# Patient Record
Sex: Female | Born: 1985 | Race: Black or African American | Hispanic: No | Marital: Single | Smoking: Never smoker
Health system: Southern US, Community
[De-identification: ages and names within clinical notes are randomized; demographics above are authoritative.]

## PROBLEM LIST (undated history)

## (undated) DIAGNOSIS — I10 Essential (primary) hypertension: Secondary | ICD-10-CM

---
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
## 2006-11-11 ENCOUNTER — Emergency Department (HOSPITAL_COMMUNITY): Admission: EM | Admit: 2006-11-11 | Discharge: 2006-11-11 | Payer: Self-pay | Admitting: Emergency Medicine

## 2006-12-02 ENCOUNTER — Encounter: Admission: RE | Admit: 2006-12-02 | Discharge: 2006-12-02 | Payer: Self-pay | Admitting: Orthopedic Surgery

## 2006-12-21 ENCOUNTER — Encounter: Admission: RE | Admit: 2006-12-21 | Discharge: 2007-01-04 | Payer: Self-pay | Admitting: Orthopedic Surgery

## 2008-01-16 ENCOUNTER — Emergency Department (HOSPITAL_COMMUNITY): Admission: EM | Admit: 2008-01-16 | Discharge: 2008-01-16 | Payer: Self-pay | Admitting: Emergency Medicine

## 2008-07-31 ENCOUNTER — Emergency Department (HOSPITAL_COMMUNITY): Admission: EM | Admit: 2008-07-31 | Discharge: 2008-07-31 | Payer: Self-pay | Admitting: Emergency Medicine

## 2009-05-16 ENCOUNTER — Emergency Department (HOSPITAL_COMMUNITY): Admission: EM | Admit: 2009-05-16 | Discharge: 2009-05-17 | Payer: Self-pay | Admitting: Emergency Medicine

## 2009-11-03 ENCOUNTER — Emergency Department (HOSPITAL_COMMUNITY): Admission: EM | Admit: 2009-11-03 | Discharge: 2009-11-03 | Payer: Self-pay | Admitting: Emergency Medicine

## 2009-11-03 ENCOUNTER — Encounter (INDEPENDENT_AMBULATORY_CARE_PROVIDER_SITE_OTHER): Payer: Self-pay | Admitting: Emergency Medicine

## 2009-11-03 ENCOUNTER — Ambulatory Visit: Payer: Self-pay | Admitting: Vascular Surgery

## 2010-12-17 ENCOUNTER — Emergency Department (HOSPITAL_COMMUNITY)
Admission: EM | Admit: 2010-12-17 | Discharge: 2010-12-17 | Disposition: A | Discharge status: Home / Self Care | Payer: Self-pay | Source: Home / Self Care | Attending: Family Medicine | Admitting: Family Medicine

## 2010-12-17 ENCOUNTER — Emergency Department (INDEPENDENT_AMBULATORY_CARE_PROVIDER_SITE_OTHER): Payer: 59

## 2010-12-17 DIAGNOSIS — S40011A Contusion of right shoulder, initial encounter: Secondary | ICD-10-CM

## 2010-12-17 DIAGNOSIS — S4980XA Other specified injuries of shoulder and upper arm, unspecified arm, initial encounter: Secondary | ICD-10-CM

## 2010-12-17 DIAGNOSIS — S40019A Contusion of unspecified shoulder, initial encounter: Secondary | ICD-10-CM

## 2010-12-17 DIAGNOSIS — S46909A Unspecified injury of unspecified muscle, fascia and tendon at shoulder and upper arm level, unspecified arm, initial encounter: Secondary | ICD-10-CM

## 2010-12-17 MED ORDER — IBUPROFEN 800 MG PO TABS: 800.0000 mg | ORAL_TABLET | Freq: Three times a day (TID) | ORAL | Status: AC

## 2010-12-17 NOTE — ED Notes (Signed)
Pt states she slipped and fell at work on Saturday, landed on rt shoulder.  C/o pain to rt shoulder- states she has torn the rotator cuff in this shoulder several years ago but did not have it fixed.  States this is not a worker's compensation case.

## 2010-12-17 NOTE — ED Provider Notes (Signed)
History     CSN: [id number] Arrival date & time: 12/17/2010  8:12 PM   First MD Initiated Contact with Patient 12/17/10 1852      Chief Complaint  Patient presents with  . Shoulder Injury    (Consider location/radiation/quality/duration/timing/severity/associated sxs/prior treatment) Patient is a 25 y.o. female presenting with shoulder injury. The history is provided by the patient.  Shoulder Injury This is a new problem. The current episode started more than 2 days ago (fell directly on right shoulder at work). The problem has not changed since onset.Associated symptoms comments: H/o rotator cuff injury 3 yr ago.    History reviewed. No pertinent past medical history.  History reviewed. No pertinent past surgical history.  No family history on file.  History  Substance Use Topics  . Smoking status: Never Smoker   . Smokeless tobacco: Not on file  . Alcohol Use: No    OB History    Grav Para Term Preterm Abortions TAB SAB Ect Mult Living                  Review of Systems  Constitutional: Negative.   Musculoskeletal: Positive for joint swelling.  Skin: Negative.     Allergies  Review of patient's allergies indicates no known allergies.  Home Medications  No current outpatient prescriptions on file.  BP 148/90  Pulse 75  Temp(Src) 98.3 F (36.8 C) (Oral)  Resp 16  SpO2 99%  LMP 11/26/2010  Physical Exam  Constitutional: She appears well-developed and well-nourished.  Musculoskeletal: Normal range of motion. She exhibits tenderness.       Arms:   ED Course  Procedures (including critical care time)  Labs Reviewed - No data to display No results found.   No diagnosis found.    MDM  X-rays reviewed and report per radiologist.         Barkley Bruns, MD 12/17/10 2116

## 2011-11-17 ENCOUNTER — Emergency Department (HOSPITAL_COMMUNITY)
Admission: EM | Admit: 2011-11-17 | Discharge: 2011-11-17 | Disposition: A | Discharge status: Home / Self Care | Payer: 59 | Attending: Emergency Medicine | Admitting: Emergency Medicine

## 2011-11-17 ENCOUNTER — Encounter (HOSPITAL_COMMUNITY): Payer: Self-pay | Admitting: Emergency Medicine

## 2011-11-17 DIAGNOSIS — R55 Syncope and collapse: Secondary | ICD-10-CM | POA: Insufficient documentation

## 2011-11-17 DIAGNOSIS — R51 Headache: Secondary | ICD-10-CM | POA: Insufficient documentation

## 2011-11-17 LAB — POCT PREGNANCY, URINE: Preg Test, Ur: NEGATIVE

## 2011-11-17 MED ORDER — SODIUM CHLORIDE 0.9 % IV BOLUS (SEPSIS)
1000.0000 mL | Freq: Once | INTRAVENOUS | Status: AC
  Administered 2011-11-17: 1000 mL via INTRAVENOUS

## 2011-11-17 MED ORDER — IBUPROFEN 600 MG PO TABS: 600.0000 mg | ORAL_TABLET | Freq: Three times a day (TID) | ORAL | Status: DC | PRN

## 2011-11-17 MED ORDER — KETOROLAC TROMETHAMINE 30 MG/ML IJ SOLN
30.0000 mg | Freq: Once | INTRAMUSCULAR | Status: AC
  Administered 2011-11-17: 30 mg via INTRAVENOUS
  Filled 2011-11-17: qty 1

## 2011-11-17 NOTE — ED Provider Notes (Signed)
History     CSN: [id number]  Arrival date & time 11/17/11  1140   First MD Initiated Contact with Patient 11/17/11 1209      Chief Complaint  Patient presents with  . Loss of Consciousness    (Consider location/radiation/quality/duration/timing/severity/associated sxs/prior treatment) HPI Comments: Was working at [employer] and the patient states she developed a headache and lightheadedness. She went to the bathroom and put a wet rag on her face. The next thing she remembers was waking up on any ambulance stretcher. She now states she feels better and ambulated to the bathroom without difficulty. She has a moderate left temporal headache which has improved since this morning. It was gradual onset and not the worst of her life. No neck pain.  Patient is a 26 y.o. female presenting with syncope. The history is provided by the patient and a parent. No language interpreter was used.  Loss of Consciousness This is a new problem. The current episode started today. The problem occurs rarely. The problem has been resolved. Associated symptoms include headaches. Pertinent negatives include no abdominal pain, arthralgias, chest pain, chills, congestion, coughing, fatigue, fever, joint swelling, myalgias, nausea, neck pain, rash, vertigo, vomiting or weakness. Nothing aggravates the symptoms. She has tried nothing for the symptoms. The treatment provided no relief.    No past medical history on file.  No past surgical history on file.  No family history on file.  History  Substance Use Topics  . Smoking status: Never Smoker   . Smokeless tobacco: Not on file  . Alcohol Use: No    OB History    Grav Para Term Preterm Abortions TAB SAB Ect Mult Living                  Review of Systems  Constitutional: Negative for fever, chills, activity change, appetite change and fatigue.  HENT: Negative for congestion, rhinorrhea, neck pain, neck stiffness and sinus pressure.   Eyes: Negative for  discharge and visual disturbance.  Respiratory: Negative for cough, chest tightness, shortness of breath, wheezing and stridor.   Cardiovascular: Positive for syncope. Negative for chest pain and leg swelling.  Gastrointestinal: Negative for nausea, vomiting, abdominal pain, diarrhea and abdominal distention.  Genitourinary: Negative for decreased urine volume and difficulty urinating.  Musculoskeletal: Negative for myalgias, back pain, joint swelling and arthralgias.  Skin: Negative for color change, pallor and rash.  Neurological: Positive for syncope and headaches. Negative for vertigo, weakness and light-headedness.  Psychiatric/Behavioral: Negative for behavioral problems and agitation.  All other systems reviewed and are negative.    Allergies  Review of patient's allergies indicates no known allergies.  Home Medications  No current outpatient prescriptions on file.  BP 159/96  Pulse 64  Temp 98.5 F (36.9 C) (Oral)  Resp 18  SpO2 100%  LMP 11/08/2011  Physical Exam  Nursing note and vitals reviewed. Constitutional: She is oriented to person, place, and time. She appears well-developed and well-nourished. No distress.  HENT:  Head: Normocephalic and atraumatic.  Mouth/Throat: No oropharyngeal exudate.  Eyes: EOM are normal. Pupils are equal, round, and reactive to light. Right eye exhibits no discharge. Left eye exhibits no discharge.  Neck: Normal range of motion. Neck supple. No JVD present.  Cardiovascular: Normal rate, regular rhythm and normal heart sounds.   Pulmonary/Chest: Effort normal and breath sounds normal. No stridor. No respiratory distress. She exhibits no tenderness.  Abdominal: Soft. Bowel sounds are normal. She exhibits no distension. There is no tenderness. There is  no guarding.  Musculoskeletal: Normal range of motion. She exhibits no edema and no tenderness.  Neurological: She is alert and oriented to person, place, and time. She has normal reflexes.  She displays normal reflexes (2+ patel). No cranial nerve deficit. She exhibits normal muscle tone. Coordination (nml f to n) normal.       fundo exam nml, no papilledema  Skin: Skin is warm and dry. No rash noted. She is not diaphoretic.  Psychiatric: She has a normal mood and affect. Her behavior is normal. Judgment and thought content normal.    ED Course  Procedures (including critical care time)   Labs Reviewed  POCT PREGNANCY, URINE   No results found.   1. Syncope   2. Headache      Date: 11/17/2011  Rate: 61  Rhythm: normal sinus rhythm  QRS Axis: normal  Intervals: normal  ST/T Wave abnormalities: TW flattening V4-6, inf leads  Conduction Disutrbances: none  Narrative Interpretation: nonspec TW findings. No wpw, brugada, or long QT  Old EKG Reviewed: none   MDM  12:40 PM syncope likely related to orthostatic hypotension or possible hypoglycemia or possible vasovagal (happened after putting rag on face) reaction. She feels better here, will give 1 L normal saline, pregnancy test negative. Doubt seizure, stroke, no recent infectious symptoms. Has a left-sided headache so we'll give Toradol for this and reassess. Normal neuro exam, no red flags. EKG is normal without any signs of underlying arrhythmia I have considered but doubt the possible etiologies of the patient's headache including subarachnoid hemorrhage, increased intracranial pressure, epidural or subdural hematoma, CVA, dural sinus venous thrombosis, meningitis or encephalitis.   2:02 PM pt feels better. Back to baseline. Pt deemed stable for discharge. Return precautions were provided and pt expressed understanding to return to ED if any acute symptoms return. Follow up was instructed which pt also expressed understanding. All questions were answered and pt was in agreement w/ plan.       Warrick Parisian, MD 11/17/11 1402  Warrick Parisian, MD 11/17/11 [phone]

## 2011-11-17 NOTE — ED Notes (Signed)
Pt went to work today without eating. Began to c/o dizziness, went to the bathroom and was found down by bystander. On EMS arrival patient slightly hypertensive, alert, oriented x4. CBG 80, 20g LAC.

## 2011-11-17 NOTE — ED Notes (Signed)
Pt c/o h/a pt medicated  Grandmother in room bolus done

## 2011-11-21 NOTE — ED Provider Notes (Signed)
I saw and evaluated the patient, reviewed the resident's note and I agree with the findings and plan.  Tobin Chad, MD 11/21/11 [phone]

## 2012-11-19 ENCOUNTER — Emergency Department (HOSPITAL_COMMUNITY)
Admission: EM | Admit: 2012-11-19 | Discharge: 2012-11-19 | Disposition: A | Discharge status: Home / Self Care | Payer: Medicaid Other | Attending: Emergency Medicine | Admitting: Emergency Medicine

## 2012-11-19 DIAGNOSIS — J020 Streptococcal pharyngitis: Secondary | ICD-10-CM | POA: Insufficient documentation

## 2012-11-19 LAB — RAPID STREP SCREEN (MED CTR MEBANE ONLY): Streptococcus, Group A Screen (Direct): POSITIVE — AB

## 2012-11-19 MED ORDER — PENICILLIN G BENZATHINE 1200000 UNIT/2ML IM SUSP
1.2000 10*6.[IU] | Freq: Once | INTRAMUSCULAR | Status: AC
  Administered 2012-11-19: 1.2 10*6.[IU] via INTRAMUSCULAR
  Filled 2012-11-19: qty 2

## 2012-11-19 NOTE — ED Provider Notes (Signed)
CSN: [id number]     Arrival date & time 11/19/12  1954 History   First MD Initiated Contact with Patient 11/19/12 2002    This chart was scribed for Earley Favor NP, a non-physician practitioner working with Gavin Pound. Oletta Lamas, MD by Lewanda Rife, ED Scribe. This patient was seen in room WTR5/WTR5 and the patient's care was started at 8:55 PM     Chief Complaint  Patient presents with  . Sore Throat   (Consider location/radiation/quality/duration/timing/severity/associated sxs/prior Treatment) The history is provided by the patient. No language interpreter was used.   HPI Comments: Melody Grant is a 27 y.o. female who presents to the Emergency Department complaining of constant worsening sore throat onset 3 days. Reports associated subjective fever, and mild non-productive cough. Reports pain is exacerbated when swallowing. Reports taking ibuprofen q6 hours with mild relief of symptoms. Denies associated otalgia, shortness of breath, and rhinorrhea. Denies sick contacts.  LMP 10/29/12  No past medical history on file. No past surgical history on file. No family history on file. History  Substance Use Topics  . Smoking status: Never Smoker   . Smokeless tobacco: Not on file  . Alcohol Use: No   OB History   Grav Para Term Preterm Abortions TAB SAB Ect Mult Living                 Review of Systems  Constitutional: Positive for fever.  HENT: Positive for sore throat.   Respiratory: Negative for cough.   Musculoskeletal: Negative for neck pain and neck stiffness.  Neurological: Negative for dizziness and headaches.  All other systems reviewed and are negative.   A complete 10 system review of systems was obtained and all systems are negative except as noted in the HPI and PMHx.     Allergies  Review of patient's allergies indicates no known allergies.  Home Medications   Current Outpatient Rx  Name  Route  Sig  Dispense  Refill  . ibuprofen (ADVIL,MOTRIN) 200 MG  tablet   Oral   Take 400 mg by mouth every 6 (six) hours as needed for headache or mild pain.          BP 140/102  Pulse 96  Temp(Src) 99.1 F (37.3 C) (Oral)  Resp 16  SpO2 99% Physical Exam  Nursing note and vitals reviewed. Constitutional: She is oriented to person, place, and time. She appears well-developed and well-nourished. No distress.  HENT:  Head: Normocephalic and atraumatic.  Mouth/Throat: Uvula is midline and mucous membranes are normal. Uvula swelling present. Posterior oropharyngeal erythema present. No oropharyngeal exudate.  Pea-sized cervical adenopathy bilaterally   Eyes: EOM are normal.  Neck: Neck supple. No tracheal deviation present.  Cardiovascular: Normal rate and regular rhythm.   Pulmonary/Chest: Effort normal and breath sounds normal. No respiratory distress.  Musculoskeletal: Normal range of motion.  Neurological: She is alert and oriented to person, place, and time.  Skin: Skin is warm and dry.  Psychiatric: She has a normal mood and affect. Her behavior is normal.    ED Course  Procedures (including critical care time) COORDINATION OF CARE:  Nursing notes reviewed. Vital signs reviewed. Initial pt interview and examination performed.   8:55 PM-Discussed work up plan with pt at bedside, which includes rapid strep screen. Pt agrees with plan.   Treatment plan initiated: Medications  penicillin g benzathine (BICILLIN LA) 1200000 UNIT/2ML injection 1.2 Million Units (1.2 Million Units Intramuscular Given 11/19/12 2053)     Initial diagnostic testing ordered.  Labs Review Labs Reviewed  RAPID STREP SCREEN - Abnormal; Notable for the following:    Streptococcus, Group A Screen (Direct) POSITIVE (*)    All other components within normal limits   Imaging Review No results found.  EKG Interpretation   None       MDM   1. Strep pharyngitis    + strep study  Discussed with patient and she opted for IM Penicillin  I personally  performed the services described in this documentation, which was scribed in my presence. The recorded information has been reviewed and is accurate.  Arman Filter, NP 11/19/12 2055

## 2012-11-19 NOTE — ED Notes (Signed)
Pt c/o sore throat, neck swelling and fever since Thursday. Pt states she has a slight cough. States worsening pain with swallowing.

## 2012-11-20 NOTE — ED Provider Notes (Signed)
Medical screening examination/treatment/procedure(s) were performed by non-physician practitioner and as supervising physician I was immediately available for consultation/collaboration.  EKG Interpretation   None         Teresita Fanton Y. Minh Jasper, MD 11/20/12 0026 

## 2013-01-11 ENCOUNTER — Encounter (HOSPITAL_COMMUNITY): Payer: Self-pay | Admitting: Emergency Medicine

## 2013-01-11 ENCOUNTER — Emergency Department (HOSPITAL_COMMUNITY)
Admission: EM | Admit: 2013-01-11 | Discharge: 2013-01-11 | Disposition: A | Discharge status: Home / Self Care | Payer: Medicaid Other | Attending: Emergency Medicine | Admitting: Emergency Medicine

## 2013-01-11 DIAGNOSIS — J111 Influenza due to unidentified influenza virus with other respiratory manifestations: Secondary | ICD-10-CM | POA: Insufficient documentation

## 2013-01-11 DIAGNOSIS — Z8619 Personal history of other infectious and parasitic diseases: Secondary | ICD-10-CM | POA: Insufficient documentation

## 2013-01-11 DIAGNOSIS — J029 Acute pharyngitis, unspecified: Secondary | ICD-10-CM | POA: Insufficient documentation

## 2013-01-11 DIAGNOSIS — Z79899 Other long term (current) drug therapy: Secondary | ICD-10-CM | POA: Insufficient documentation

## 2013-01-11 DIAGNOSIS — R000 Tachycardia, unspecified: Secondary | ICD-10-CM | POA: Insufficient documentation

## 2013-01-11 DIAGNOSIS — IMO0001 Reserved for inherently not codable concepts without codable children: Secondary | ICD-10-CM | POA: Insufficient documentation

## 2013-01-11 LAB — RAPID STREP SCREEN (MED CTR MEBANE ONLY): Streptococcus, Group A Screen (Direct): NEGATIVE

## 2013-01-11 MED ORDER — ACETAMINOPHEN 500 MG PO TABS
ORAL_TABLET | ORAL | Status: AC
  Filled 2013-01-11: qty 2

## 2013-01-11 MED ORDER — KETOROLAC TROMETHAMINE 60 MG/2ML IM SOLN
INTRAMUSCULAR | Status: AC
  Filled 2013-01-11: qty 2

## 2013-01-11 MED ORDER — ACETAMINOPHEN 500 MG PO TABS
1000.0000 mg | ORAL_TABLET | Freq: Once | ORAL | Status: AC
  Administered 2013-01-11: 1000 mg via ORAL

## 2013-01-11 MED ORDER — NAPROXEN 500 MG PO TABS: 500.0000 mg | ORAL_TABLET | Freq: Two times a day (BID) | ORAL | Status: DC

## 2013-01-11 MED ORDER — KETOROLAC TROMETHAMINE 60 MG/2ML IM SOLN
60.0000 mg | Freq: Once | INTRAMUSCULAR | Status: AC
  Administered 2013-01-11: 60 mg via INTRAMUSCULAR

## 2013-01-11 NOTE — ED Provider Notes (Signed)
CSN: [id number]     Arrival date & time 01/11/13  0017 History   First MD Initiated Contact with Patient 01/11/13 0215     Chief Complaint  Patient presents with  . Sore Throat   (Consider location/radiation/quality/duration/timing/severity/associated sxs/prior Treatment) HPI Comments: Patient presents with complaint of runny nose, sore throat, cough, muscle aches, and fever for the past 3 days. She is concerned that she has strep throat because she had this last month. No nausea, vomiting, diarrhea. Patient did not have a flu shot. She's been taking over-the-counter TheraFlu with minimal relief. The onset of this condition was acute. The course is constant. Aggravating factors: none.     Patient is a 27 y.o. female presenting with pharyngitis. The history is provided by the patient.  Sore Throat    History reviewed. No pertinent past medical history. History reviewed. No pertinent past surgical history. History reviewed. No pertinent family history. History  Substance Use Topics  . Smoking status: Never Smoker   . Smokeless tobacco: Not on file  . Alcohol Use: No   OB History   Grav Para Term Preterm Abortions TAB SAB Ect Mult Living                 Review of Systems  All other systems reviewed and are negative.    Allergies  Review of patient's allergies indicates no known allergies.  Home Medications   Current Outpatient Rx  Name  Route  Sig  Dispense  Refill  . ibuprofen (ADVIL,MOTRIN) 200 MG tablet   Oral   Take 400 mg by mouth every 6 (six) hours as needed for headache or mild pain.         Marland Kitchen OVER THE COUNTER MEDICATION   Oral   Take 1 packet by mouth 2 (two) times daily. OTC. Flu medication          BP 139/103  Pulse 116  Temp(Src) 100.6 F (38.1 C) (Oral)  Resp 20  Ht 5\' 4"  (1.626 m)  Wt 140 lb (63.504 kg)  BMI 24.02 kg/m2  LMP 01/11/2013 Physical Exam  Nursing note and vitals reviewed. Constitutional: She appears well-developed and  well-nourished.  HENT:  Head: Normocephalic and atraumatic.  Right Ear: Tympanic membrane, external ear and ear canal normal.  Left Ear: Tympanic membrane, external ear and ear canal normal.  Nose: Mucosal edema and rhinorrhea present.  Mouth/Throat: Uvula is midline and mucous membranes are normal. Mucous membranes are not dry. No oral lesions. No trismus in the jaw. No uvula swelling. Posterior oropharyngeal erythema present. No oropharyngeal exudate, posterior oropharyngeal edema or tonsillar abscesses.  Eyes: Conjunctivae are normal. Right eye exhibits no discharge. Left eye exhibits no discharge.  Neck: Normal range of motion. Neck supple.  Cardiovascular: Regular rhythm and normal heart sounds.  Tachycardia present.   Pulmonary/Chest: Effort normal and breath sounds normal. No respiratory distress. She has no wheezes. She has no rales.  Abdominal: Soft. There is no tenderness.  Lymphadenopathy:    She has no cervical adenopathy.  Neurological: She is alert.  Skin: Skin is warm and dry.  Psychiatric: She has a normal mood and affect.    ED Course  Procedures (including critical care time) Labs Review Labs Reviewed  RAPID STREP SCREEN  CULTURE, GROUP A STREP   Imaging Review No results found.  EKG Interpretation   None      2:32 AM Patient seen and examined. Work-up initiated. Medications ordered. Temp 102.31F on my exam.   Vital signs  reviewed and are as follows: Filed Vitals:   01/11/13 0039  BP: 139/103  Pulse: 116  Temp: 100.6 F (38.1 C)  Resp: 20   Toradol, tylenol given in ED. Patient discharged to home. Encouraged to rest and drink plenty of fluids.  Patient told to return to ED or see their primary doctor if their symptoms worsen, high fever not controlled with tylenol, persistent vomiting, they feel they are dehydrated, or if they have any other concerns.  Patient verbalized understanding and agreed with plan.    MDM   1. Influenza-like illness     Patient with symptoms consistent with influenza.  She is immunocompetent. Vitals are stable, fever noted.  No signs of dehydration, tolerating PO's.  Do not suspect PNA. Lungs are clear.  Supportive therapy indicated with return if symptoms worsen.  She appears well, non-toxic. Patient counseled.     Renne Crigler, PA-C 01/11/13 [phone]

## 2013-01-11 NOTE — ED Notes (Signed)
Pt states she had strep throat last month and now complains of another sore throat since Sunday

## 2013-01-12 NOTE — ED Provider Notes (Signed)
Medical screening examination/treatment/procedure(s) were performed by non-physician practitioner and as supervising physician I was immediately available for consultation/collaboration.    Asjah Rauda, MD 01/12/13 1002 

## 2013-01-14 LAB — CULTURE, GROUP A STREP

## 2013-08-05 ENCOUNTER — Emergency Department (HOSPITAL_COMMUNITY)
Admission: EM | Admit: 2013-08-05 | Discharge: 2013-08-05 | Disposition: A | Discharge status: Home / Self Care | Payer: 59 | Attending: Emergency Medicine | Admitting: Emergency Medicine

## 2013-08-05 ENCOUNTER — Encounter (HOSPITAL_COMMUNITY): Payer: Self-pay | Admitting: Emergency Medicine

## 2013-08-05 ENCOUNTER — Emergency Department (HOSPITAL_COMMUNITY): Payer: 59

## 2013-08-05 DIAGNOSIS — J069 Acute upper respiratory infection, unspecified: Secondary | ICD-10-CM | POA: Insufficient documentation

## 2013-08-05 DIAGNOSIS — R059 Cough, unspecified: Secondary | ICD-10-CM | POA: Insufficient documentation

## 2013-08-05 DIAGNOSIS — H9209 Otalgia, unspecified ear: Secondary | ICD-10-CM | POA: Insufficient documentation

## 2013-08-05 DIAGNOSIS — R05 Cough: Secondary | ICD-10-CM | POA: Insufficient documentation

## 2013-08-05 DIAGNOSIS — J029 Acute pharyngitis, unspecified: Secondary | ICD-10-CM | POA: Insufficient documentation

## 2013-08-05 LAB — RAPID STREP SCREEN (MED CTR MEBANE ONLY): Streptococcus, Group A Screen (Direct): NEGATIVE

## 2013-08-05 MED ORDER — GUAIFENESIN 100 MG/5ML PO LIQD: 100.0000 mg | ORAL | Status: DC | PRN

## 2013-08-05 MED ORDER — HYDROCODONE-ACETAMINOPHEN 7.5-325 MG/15ML PO SOLN: 15.0000 mL | Freq: Four times a day (QID) | ORAL | Status: AC | PRN

## 2013-08-05 MED ORDER — ACETAMINOPHEN 325 MG PO TABS
650.0000 mg | ORAL_TABLET | Freq: Four times a day (QID) | ORAL | Status: DC | PRN
  Administered 2013-08-05: 650 mg via ORAL
  Filled 2013-08-05: qty 2

## 2013-08-05 NOTE — ED Provider Notes (Signed)
CSN: [id number]     Arrival date & time 08/05/13  1906 History   First MD Initiated Contact with Patient 08/05/13 1956     Chief Complaint  Patient presents with  . Sore Throat  . Fever  . Cough     (Consider location/radiation/quality/duration/timing/severity/associated sxs/prior Treatment) HPI  28 year old female presents with chief complaints of URI symptoms.patient reports since last night she has developed fever as high as 102, chills, sore throat, occasional sneeze, pleuritic chest pain, cough, and decreased appetite. She took over-the-counter cough medication with minimal relief. Her symptoms persist. She reported history of strep throat in the past. She denies any voice changes but does endorse increased pain with swallowing. No complaints of runny nose, or ear pain. Denies any rash. Denies any nausea vomiting diarrhea, shortness of breath, abdominal pain, back pain, or rash. Patient otherwise healthy with no history of asthma.  History reviewed. No pertinent past medical history. No past surgical history on file. No family history on file. History  Substance Use Topics  . Smoking status: Never Smoker   . Smokeless tobacco: Not on file  . Alcohol Use: No   OB History   Grav Para Term Preterm Abortions TAB SAB Ect Mult Living                 Review of Systems  All other systems reviewed and are negative.     Allergies  Review of patient's allergies indicates no known allergies.  Home Medications   Prior to Admission medications   Medication Sig Start Date End Date Taking? Authorizing Provider  ibuprofen (ADVIL,MOTRIN) 200 MG tablet Take 400 mg by mouth every 6 (six) hours as needed for headache or mild pain.   Yes Historical Provider, MD   BP 146/84  Pulse 119  Temp(Src) 100.5 F (38.1 C) (Oral)  Resp 18  SpO2 100%  LMP 07/29/2013 Physical Exam  Nursing note and vitals reviewed. Constitutional: She is oriented to person, place, and time. She appears  well-developed and well-nourished. No distress.  African American female appears to be in no acute distress, nontoxic in appearance  HENT:  Head: Atraumatic.  Right Ear: External ear normal.  Left Ear: External ear normal.  Nose: Nose normal.  Throat: Uvula is midline, no tonsillar enlargement or exudates. No evidence of peritonsillar abscess or retropharyngeal abscess. Mild posterior oropharyngeal erythema. No trismus.  Eyes: Conjunctivae are normal.  Neck: Neck supple.  Cardiovascular: Normal rate and regular rhythm.   Pulmonary/Chest: Effort normal and breath sounds normal. No respiratory distress. She has no wheezes.  Abdominal: Soft. There is no tenderness.  Lymphadenopathy:    She has no cervical adenopathy.  Neurological: She is alert and oriented to person, place, and time.  Skin: No rash noted.  Psychiatric: She has a normal mood and affect.    ED Course  Procedures (including critical care time)  8:11 PM Pt with URI sxs.  CXR neg.  Awaits rapid strep test.    8:52 PM Strep test neg.  Reassurance given.  Will treat sxs.  Pt stable for discharge.    Labs Review Labs Reviewed  RAPID STREP SCREEN  CULTURE, GROUP A STREP    Imaging Review Dg Chest 2 View  08/05/2013   CLINICAL DATA:  Fever and cough  EXAM: CHEST  2 VIEW  COMPARISON:  None.  FINDINGS: Lungs are clear. Heart size and pulmonary vascularity are normal. No adenopathy. No bone lesions.  IMPRESSION: No abnormality noted.   Electronically Signed  By: Bretta Bang M.D.   On: 08/05/2013 19:58     EKG Interpretation None      MDM   Final diagnoses:  URI (upper respiratory infection)    BP 140/97  Pulse 108  Temp(Src) 102.7 F (39.3 C) (Oral)  Resp 18  SpO2 100%  LMP 07/29/2013  I have reviewed nursing notes and vital signs. I personally reviewed the imaging tests through PACS system  I reviewed available ER/hospitalization records thought the EMR     Fayrene Helper, [place] 08/05/13 2052

## 2013-08-05 NOTE — ED Provider Notes (Signed)
Medical screening examination/treatment/procedure(s) were performed by non-physician practitioner and as supervising physician I was immediately available for consultation/collaboration.   EKG Interpretation None        Dagmar HaitWilliam Reinaldo Helt, MD 08/05/13 2231

## 2013-08-05 NOTE — ED Notes (Signed)
Requested patient to urinate. 

## 2013-08-05 NOTE — ED Notes (Signed)
Pt c/o sore throat, fever, and dry cough since yesterday.

## 2013-08-05 NOTE — Discharge Instructions (Signed)
Upper Respiratory Infection, Adult An upper respiratory infection (URI) is also sometimes known as the common cold. The upper respiratory tract includes the nose, sinuses, throat, trachea, and bronchi. Bronchi are the airways leading to the lungs. Most people improve within 1 week, but symptoms can last up to 2 weeks. A residual cough may last even longer.  CAUSES Many different viruses can infect the tissues lining the upper respiratory tract. The tissues become irritated and inflamed and often become very moist. Mucus production is also common. A cold is contagious. You can easily spread the virus to others by oral contact. This includes kissing, sharing a glass, coughing, or sneezing. Touching your mouth or nose and then touching a surface, which is then touched by another person, can also spread the virus. SYMPTOMS  Symptoms typically develop 1 to 3 days after you come in contact with a cold virus. Symptoms vary from person to person. They may include:  Runny nose.  Sneezing.  Nasal congestion.  Sinus irritation.  Sore throat.  Loss of voice (laryngitis).  Cough.  Fatigue.  Muscle aches.  Loss of appetite.  Headache.  Low-grade fever. DIAGNOSIS  You might diagnose your own cold based on familiar symptoms, since most people get a cold 2 to 3 times a year. Your caregiver can confirm this based on your exam. Most importantly, your caregiver can check that your symptoms are not due to another disease such as strep throat, sinusitis, pneumonia, asthma, or epiglottitis. Blood tests, throat tests, and X-rays are not necessary to diagnose a common cold, but they may sometimes be helpful in excluding other more serious diseases. Your caregiver will decide if any further tests are required. RISKS AND COMPLICATIONS  You may be at risk for a more severe case of the common cold if you smoke cigarettes, have chronic heart disease (such as heart failure) or lung disease (such as asthma), or if  you have a weakened immune system. The very young and very old are also at risk for more serious infections. Bacterial sinusitis, middle ear infections, and bacterial pneumonia can complicate the common cold. The common cold can worsen asthma and chronic obstructive pulmonary disease (COPD). Sometimes, these complications can require emergency medical care and may be life-threatening. PREVENTION  The best way to protect against getting a cold is to practice good hygiene. Avoid oral or hand contact with people with cold symptoms. Wash your hands often if contact occurs. There is no clear evidence that vitamin C, vitamin E, echinacea, or exercise reduces the chance of developing a cold. However, it is always recommended to get plenty of rest and practice good nutrition. TREATMENT  Treatment is directed at relieving symptoms. There is no cure. Antibiotics are not effective, because the infection is caused by a virus, not by bacteria. Treatment may include:  Increased fluid intake. Sports drinks offer valuable electrolytes, sugars, and fluids.  Breathing heated mist or steam (vaporizer or shower).  Eating chicken soup or other clear broths, and maintaining good nutrition.  Getting plenty of rest.  Using gargles or lozenges for comfort.  Controlling fevers with ibuprofen or acetaminophen as directed by your caregiver.  Increasing usage of your inhaler if you have asthma. Zinc gel and zinc lozenges, taken in the first 24 hours of the common cold, can shorten the duration and lessen the severity of symptoms. Pain medicines may help with fever, muscle aches, and throat pain. A variety of non-prescription medicines are available to treat congestion and runny nose. Your caregiver   can make recommendations and may suggest nasal or lung inhalers for other symptoms.  HOME CARE INSTRUCTIONS   Only take over-the-counter or prescription medicines for pain, discomfort, or fever as directed by your  caregiver.  Use a warm mist humidifier or inhale steam from a shower to increase air moisture. This may keep secretions moist and make it easier to breathe.  Drink enough water and fluids to keep your urine clear or pale yellow.  Rest as needed.  Return to work when your temperature has returned to normal or as your caregiver advises. You may need to stay home longer to avoid infecting others. You can also use a face mask and careful hand washing to prevent spread of the virus. SEEK MEDICAL CARE IF:   After the first few days, you feel you are getting worse rather than better.  You need your caregiver's advice about medicines to control symptoms.  You develop chills, worsening shortness of breath, or brown or red sputum. These may be signs of pneumonia.  You develop yellow or brown nasal discharge or pain in the face, especially when you bend forward. These may be signs of sinusitis.  You develop a fever, swollen neck glands, pain with swallowing, or white areas in the back of your throat. These may be signs of strep throat. SEEK IMMEDIATE MEDICAL CARE IF:   You have a fever.  You develop severe or persistent headache, ear pain, sinus pain, or chest pain.  You develop wheezing, a prolonged cough, cough up blood, or have a change in your usual mucus (if you have chronic lung disease).  You develop sore muscles or a stiff neck. Document Released: 06/24/2000 Document Revised: 03/23/2011 Document Reviewed: 04/05/2013 [employer] Patient Information 2015 [employer]. This information is not intended to replace advice given to you by your health care provider. Make sure you discuss any questions you have with your health care provider.  

## 2013-08-06 LAB — CULTURE, GROUP A STREP

## 2015-08-17 ENCOUNTER — Encounter (HOSPITAL_COMMUNITY): Payer: Self-pay | Admitting: Oncology

## 2015-08-17 ENCOUNTER — Emergency Department (HOSPITAL_COMMUNITY)
Admission: EM | Admit: 2015-08-17 | Discharge: 2015-08-18 | Disposition: A | Discharge status: Home / Self Care | Payer: 59 | Attending: Emergency Medicine | Admitting: Emergency Medicine

## 2015-08-17 DIAGNOSIS — J069 Acute upper respiratory infection, unspecified: Secondary | ICD-10-CM | POA: Insufficient documentation

## 2015-08-17 NOTE — ED Triage Notes (Signed)
Pt reports d/t sore throat x 2 days.  Able to maintain airway w/o difficulty.  Rates pain 8/10.

## 2015-08-18 LAB — RAPID STREP SCREEN (MED CTR MEBANE ONLY): STREPTOCOCCUS, GROUP A SCREEN (DIRECT): NEGATIVE

## 2015-08-18 MED ORDER — GUAIFENESIN-CODEINE 100-10 MG/5ML PO SOLN: 5.0000 mL | Freq: Three times a day (TID) | ORAL | 0 refills | Status: DC | PRN

## 2015-08-18 NOTE — ED Provider Notes (Signed)
[facility]-EMERGENCY DEPT Provider Note   CSN: [id number] Arrival date & time: 08/17/15  2300  First Provider Contact:  None       History   Chief Complaint Chief Complaint  Patient presents with  . Sore Throat    HPI Melody Grant is a 30 y.o. female.  HPI   30 year old female presents today with complaints of upper respiratory infection. Patient reports that 2 days ago she started to develop rhinorrhea, congestion and sore throat. She reports dry nonproductive cough. She denies any fevers at home, chest pain or shortness of breath. She denies any nausea vomiting abdominal pain. No rash. Reports taking NyQuil yesterday. No significant health concerns. No medications prior to arrival today.   History reviewed. No pertinent past medical history.  There are no active problems to display for this patient.   History reviewed. No pertinent surgical history.  OB History    No data available       Home Medications    Prior to Admission medications   Medication Sig Start Date End Date Taking? Authorizing Provider  guaiFENesin (ROBITUSSIN) 100 MG/5ML liquid Take 5-10 mLs (100-200 mg total) by mouth every 4 (four) hours as needed for cough. 08/05/13   Fayrene HelperBowie Tran, PA-C  guaiFENesin-codeine 100-10 MG/5ML syrup Take 5 mLs by mouth 3 (three) times daily as needed for cough. 08/18/15   Eyvonne MechanicJeffrey Nash Bolls, PA-C  ibuprofen (ADVIL,MOTRIN) 200 MG tablet Take 400 mg by mouth every 6 (six) hours as needed for headache or mild pain.    Historical Provider, MD    Family History No family history on file.  Social History Social History  Substance Use Topics  . Smoking status: Never Smoker  . Smokeless tobacco: Never Used  . Alcohol use No     Allergies   Review of patient's allergies indicates no known allergies.   Review of Systems Review of Systems  All other systems reviewed and are negative.   Physical Exam Updated Vital Signs BP (!) 163/111 (BP Location: Right Arm)   Pulse 110    Temp 99.2 F (37.3 C) (Oral)   Resp 18   SpO2 100%   Physical Exam  Constitutional: She is oriented to person, place, and time. She appears well-developed and well-nourished.  HENT:  Head: Normocephalic and atraumatic.  Right Ear: Hearing, tympanic membrane and ear canal normal.  Left Ear: Hearing, tympanic membrane and ear canal normal.  Nose: Rhinorrhea present.  Mouth/Throat: Uvula is midline and oropharynx is clear and moist. No uvula swelling. No oropharyngeal exudate, posterior oropharyngeal edema, posterior oropharyngeal erythema or tonsillar abscesses.  Eyes: Conjunctivae are normal. Pupils are equal, round, and reactive to light. Right eye exhibits no discharge. Left eye exhibits no discharge. No scleral icterus.  Neck: Normal range of motion. No JVD present. No tracheal deviation present.  Pulmonary/Chest: Effort normal and breath sounds normal. No accessory muscle usage or stridor. No apnea, no tachypnea and no bradypnea. No respiratory distress.  Neurological: She is alert and oriented to person, place, and time. Coordination normal.  Psychiatric: She has a normal mood and affect. Her behavior is normal. Judgment and thought content normal.  Nursing note and vitals reviewed.    ED Treatments / Results  Labs (all labs ordered are listed, but only abnormal results are displayed) Labs Reviewed  R[facility]ID [facility])  CULTURE, GROUP A STREP [facility])    EKG  EKG Interpretation None       Radiology No results found.  Procedures  Procedures (including critical care time)  Medications Ordered in ED Medications - No data to display   Initial Impression / Assessment and Plan / ED Course  I have reviewed the triage vital signs and the nursing notes.  Pertinent labs & imaging results that were available during my care of the patient were reviewed by me and considered in my medical decision making (see chart for details).  Clinical Course    Final  Clinical Impressions(s) / ED Diagnoses   Final diagnoses:  URI (upper respiratory infection)    Labs: Rapid strep negative  Imaging:  Consults:  Therapeutics:  Discharge Meds:   Assessment/Plan: Patient's presentation most consistent with viral upper respiratory infection. She is afebrile and nontoxic. Patient will be given cough medication, symptomatic care instructions, strict return precautions. Instructed follow-up with primary care provider if symptoms persist, emergency room if they worsen. She verbalized understanding and agreement today's plan.      New Prescriptions New Prescriptions   GUAIFENESIN-CODEINE 100-10 MG/5ML SYRUP    Take 5 mLs by mouth 3 (three) times daily as needed for cough.     Eyvonne Mechanic, PA-C 08/18/15 0206    Derwood Kaplan, MD 08/18/15 [phone]

## 2015-08-18 NOTE — Discharge Instructions (Signed)
Please take medication as directed, follow-up with primary care provider if symptoms persist, return to the emergency room if they worsen.

## 2015-08-20 LAB — CULTURE, GROUP A STREP (THRC)

## 2016-04-01 ENCOUNTER — Emergency Department (HOSPITAL_COMMUNITY)
Admission: EM | Admit: 2016-04-01 | Discharge: 2016-04-02 | Disposition: A | Discharge status: Home / Self Care | Payer: 59 | Attending: Emergency Medicine | Admitting: Emergency Medicine

## 2016-04-01 ENCOUNTER — Encounter (HOSPITAL_COMMUNITY): Payer: Self-pay | Admitting: Family Medicine

## 2016-04-01 DIAGNOSIS — S0501XA Injury of conjunctiva and corneal abrasion without foreign body, right eye, initial encounter: Secondary | ICD-10-CM | POA: Insufficient documentation

## 2016-04-01 DIAGNOSIS — W228XXA Striking against or struck by other objects, initial encounter: Secondary | ICD-10-CM | POA: Insufficient documentation

## 2016-04-01 DIAGNOSIS — Y939 Activity, unspecified: Secondary | ICD-10-CM | POA: Insufficient documentation

## 2016-04-01 DIAGNOSIS — Y929 Unspecified place or not applicable: Secondary | ICD-10-CM | POA: Insufficient documentation

## 2016-04-01 DIAGNOSIS — Y999 Unspecified external cause status: Secondary | ICD-10-CM | POA: Insufficient documentation

## 2016-04-01 MED ORDER — FLUORESCEIN SODIUM 0.6 MG OP STRP
1.0000 | ORAL_STRIP | Freq: Once | OPHTHALMIC | Status: AC
  Administered 2016-04-02: 1 via OPHTHALMIC
  Filled 2016-04-01: qty 1

## 2016-04-01 MED ORDER — TETRACAINE HCL 0.5 % OP SOLN
2.0000 [drp] | Freq: Once | OPHTHALMIC | Status: AC
  Administered 2016-04-02: 2 [drp] via OPHTHALMIC
  Filled 2016-04-01: qty 4

## 2016-04-01 NOTE — ED Triage Notes (Signed)
Patient reports her phone screen shattered around 16:00 today and believes a piece of it is in her right eye. The right eye is red and watery.

## 2016-04-02 MED ORDER — ERYTHROMYCIN 5 MG/GM OP OINT
TOPICAL_OINTMENT | Freq: Once | OPHTHALMIC | Status: AC
  Administered 2016-04-02: 1 via OPHTHALMIC
  Filled 2016-04-02: qty 3.5

## 2016-04-02 NOTE — Discharge Instructions (Signed)
Use the ERYTHROMYCIN OINTMENT on the right eye as follows: Place ~1 cm ribbon placed into the lower right eyelid 4-6 times per day.  Follow up with the eye specialist on Friday, 3/23. If you get worse go tomorrow or return to the ER

## 2016-04-02 NOTE — ED Provider Notes (Signed)
[facility]-EMERGENCY DEPT Provider Note   CSN: [id number] Arrival date & time: 04/01/16  2300 By signing my name below, I, Levon HedgerElizabeth Hall, attest that this documentation has been prepared under the direction and in the presence of Pricilla LovelessScott Jamisyn Langer, MD . Electronically Signed: Levon HedgerElizabeth Hall, Scribe. 04/02/2016. 12:19 AM.   History   Chief Complaint Chief Complaint  Patient presents with  . Foreign Body in Eye   HPI Melody Grant is a 31 y.o. female who presents to the Emergency Department complaining of a possible foreign body in her right lateral eye since 4 pm this afternoon. Pt states she dropped her phone this afternoon, shattering the screen. She was wiping the screen with a napkin when she felt something strike her right eye. She notes associated redness, moderate pain and watery discharge to her right eye. Pt states she attempted to wash her eye out and has used eye drops with no relief of symptoms. She does not wear glasses or contacts. Pt denies any visual disturbance and has no other complaints or symptoms at this time.    The history is provided by the patient. No language interpreter was used.   History reviewed. No pertinent past medical history.  There are no active problems to display for this patient.   History reviewed. No pertinent surgical history.  OB History    No data available     Home Medications    Prior to Admission medications   Medication Sig Start Date End Date Taking? Authorizing Provider  guaiFENesin (ROBITUSSIN) 100 MG/5ML liquid Take 5-10 mLs (100-200 mg total) by mouth every 4 (four) hours as needed for cough. 08/05/13   Fayrene HelperBowie Tran, PA-C  guaiFENesin-codeine 100-10 MG/5ML syrup Take 5 mLs by mouth 3 (three) times daily as needed for cough. 08/18/15   Eyvonne MechanicJeffrey Hedges, PA-C  ibuprofen (ADVIL,MOTRIN) 200 MG tablet Take 400 mg by mouth every 6 (six) hours as needed for headache or mild pain.    Historical Provider, MD   Family History History reviewed. No  pertinent family history.  Social History Social History  Substance Use Topics  . Smoking status: Never Smoker  . Smokeless tobacco: Never Used  . Alcohol use No    Allergies   Patient has no known allergies.  Review of Systems Review of Systems  Constitutional: Negative for fever.  Eyes: Positive for pain, discharge and redness. Negative for visual disturbance.  All other systems reviewed and are negative.  Physical Exam Updated Vital Signs BP (!) 143/113 (BP Location: Left Arm)   Pulse 88   Temp 97.7 F (36.5 C) (Oral)   Resp 16   Ht 5\' 4"  (1.626 m)   Wt 140 lb (63.5 kg)   SpO2 100%   BMI 24.03 kg/m   Physical Exam  Constitutional: She is oriented to person, place, and time. She appears well-developed and well-nourished.  HENT:  Head: Normocephalic and atraumatic.  Right Ear: External ear normal.  Left Ear: External ear normal.  Nose: Nose normal.  Eyes: EOM and lids are normal. Pupils are equal, round, and reactive to light. Lids are everted and swept, no foreign bodies found. Right eye exhibits no discharge. Left eye exhibits no discharge. Right conjunctiva is injected.  Right lateral conjunctiva injected without obvious foreign body. 2 right lateral abrasions noted. No seidel.  Cardiovascular: Normal rate and regular rhythm.   Pulmonary/Chest: Effort normal.  Neurological: She is alert and oriented to person, place, and time.  Skin: Skin is warm and dry.  Nursing  note and vitals reviewed.  ED Treatments / Results  DIAGNOSTIC STUDIES:  Oxygen Saturation is 100% on RA, normal by my interpretation.    COORDINATION OF CARE:  12:17 AM Discussed treatment plan with pt at bedside and pt agreed to plan.   Labs (all labs ordered are listed, but only abnormal results are displayed) Labs Reviewed - No data to display  EKG  EKG Interpretation None       Radiology No results found.  Procedures Procedures (including critical care time)  Medications  Ordered in ED Medications  tetracaine (PONTOCAINE) 0.5 % ophthalmic solution 2 drop (2 drops Right Eye Given 04/02/16 0025)  fluorescein ophthalmic strip 1 strip (1 strip Right Eye Given 04/02/16 0025)  erythromycin ophthalmic ointment (1 application Right Eye Given 04/02/16 0128)     Initial Impression / Assessment and Plan / ED Course  I have reviewed the triage vital signs and the nursing notes.  Pertinent labs & imaging results that were available during my care of the patient were reviewed by me and considered in my medical decision making (see chart for details).     Patient's foreign body sensation appears to be from the corneal abrasions. These are not very large and there is no obvious foreign body. No signs of an actual laceration. She was given erythromycin ointment here and instructions on home use. Follow up with ophthalmology in 2 days. Discussed return precautions. [facility] with slight decreased vision in right eye, likely from tearing as the abrasions is not over pupil.   Final Clinical Impressions(s) / ED Diagnoses   Final diagnoses:  Abrasion of right cornea, initial encounter    New Prescriptions Discharge Medication List as of 04/02/2016  1:15 AM     I personally performed the services described in this documentation, which was scribed in my presence. The recorded information has been reviewed and is accurate.     Pricilla Loveless, MD 04/02/16 [phone]

## 2016-08-05 DIAGNOSIS — R87619 Unspecified abnormal cytological findings in specimens from cervix uteri: Secondary | ICD-10-CM | POA: Insufficient documentation

## 2016-11-25 ENCOUNTER — Encounter (HOSPITAL_COMMUNITY): Payer: Self-pay | Admitting: *Deleted

## 2016-11-25 ENCOUNTER — Emergency Department (HOSPITAL_COMMUNITY): Payer: 59

## 2016-11-25 ENCOUNTER — Emergency Department (HOSPITAL_COMMUNITY)
Admission: EM | Admit: 2016-11-25 | Discharge: 2016-11-25 | Disposition: A | Discharge status: Home / Self Care | Payer: 59 | Attending: Emergency Medicine | Admitting: Emergency Medicine

## 2016-11-25 DIAGNOSIS — Z79899 Other long term (current) drug therapy: Secondary | ICD-10-CM | POA: Insufficient documentation

## 2016-11-25 DIAGNOSIS — I10 Essential (primary) hypertension: Secondary | ICD-10-CM | POA: Insufficient documentation

## 2016-11-25 DIAGNOSIS — R079 Chest pain, unspecified: Secondary | ICD-10-CM | POA: Diagnosis present

## 2016-11-25 DIAGNOSIS — R0789 Other chest pain: Secondary | ICD-10-CM | POA: Insufficient documentation

## 2016-11-25 LAB — BASIC METABOLIC PANEL
Anion gap: 9 (ref 5–15)
BUN: 10 mg/dL (ref 6–20)
CO2: 26 mmol/L (ref 22–32)
Calcium: 9.4 mg/dL (ref 8.9–10.3)
Chloride: 103 mmol/L (ref 101–111)
Creatinine, Ser: 0.88 mg/dL (ref 0.44–1.00)
GFR calc non Af Amer: >60 mL/min (ref >60)
Glucose, Bld: 94 mg/dL (ref 65–99)
POTASSIUM: 3.2 mmol/L — AB (ref 3.5–5.1)
SODIUM: 138 mmol/L (ref 135–145)

## 2016-11-25 LAB — CBC
HEMATOCRIT: 37.6 % (ref 36.0–46.0)
Hemoglobin: 13 g/dL (ref 12.0–15.0)
MCH: 31 pg (ref 26.0–34.0)
MCHC: 34.6 g/dL (ref 30.0–36.0)
MCV: 89.7 fL (ref 78.0–100.0)
PLATELETS: 334 10*3/uL (ref 150–400)
RBC: 4.19 MIL/uL (ref 3.87–5.11)
RDW: 12.4 % (ref 11.5–15.5)
WBC: 5.2 10*3/uL (ref 4.0–10.5)

## 2016-11-25 LAB — I-STAT TROPONIN, ED: Troponin i, poc: 0 ng/mL (ref 0.00–0.08)

## 2016-11-25 MED ORDER — LOSARTAN POTASSIUM 50 MG PO TABS
50.0000 mg | ORAL_TABLET | Freq: Once | ORAL | Status: AC
  Administered 2016-11-25: 50 mg via ORAL
  Filled 2016-11-25: qty 1

## 2016-11-25 MED ORDER — LOSARTAN POTASSIUM 50 MG PO TABS: 50.0000 mg | ORAL_TABLET | Freq: Every day | ORAL | 1 refills | Status: DC

## 2016-11-25 NOTE — ED Triage Notes (Signed)
Pt complains of chest pain for the past couple weeks. Pt states she has not been taking her blood pressure medication for the past couple months b/c she ran out

## 2016-11-25 NOTE — ED Provider Notes (Signed)
[facility] Provider Note   CSN: [id number] Arrival date & time: 11/25/16  1317     History   Chief Complaint Chief Complaint  Patient presents with  . Chest Pain    HPI Melody Grant is a 31 y.o. female.  The history is provided by the patient.  Chest Pain   This is a new problem. The current episode started more than 1 week ago. The problem occurs daily. The problem has not changed since onset.The pain is associated with an emotional upset. The pain is present in the substernal region. The pain is mild. Quality: heart pounding. The pain does not radiate. Associated symptoms include headaches and palpitations. Pertinent negatives include no abdominal pain, no cough, no diaphoresis, no exertional chest pressure, no fever, no leg pain, no lower extremity edema, no nausea, no near-syncope, no shortness of breath, no syncope and no vomiting. She has tried nothing for the symptoms. The treatment provided no relief.   31 year old female who presents with intermittent chest pain.  She has a history of hypertension, and states that she ran out of this medication 2 weeks ago and has not had it refilled.  Since then she has had some intermittent episodes of chest pain, describes as a sensation of her heart pounding.  States that the symptoms usually come on under stressful situations, and improves if she takes deep breaths and tries to relax.  It is not associated with food, activity or exertion.  Denies any associating shortness of breath, syncope or near syncope, edema, leg pain, nausea, vomiting, focal neurological complaints.   History reviewed. No pertinent past medical history.  There are no active problems to display for this patient.   History reviewed. No pertinent surgical history.  OB History    No data available       Home Medications    Prior to Admission medications   Medication Sig Start Date End Date Taking? Authorizing Provider    losartan (COZAAR) 50 MG tablet Take 50 mg daily by mouth.   Yes [provider]  guaiFENesin (ROBITUSSIN) 100 MG/5ML liquid Take 5-10 mLs (100-200 mg total) by mouth every 4 (four) hours as needed for cough. Patient not taking: Reported on 11/25/2016 08/05/13   Fayrene Helperran, Bowie, PA-C  guaiFENesin-codeine 100-10 MG/5ML syrup Take 5 mLs by mouth 3 (three) times daily as needed for cough. Patient not taking: Reported on 11/25/2016 08/18/15   Hedges, Tinnie GensJeffrey, PA-C  ibuprofen (ADVIL,MOTRIN) 200 MG tablet Take 400 mg by mouth every 6 (six) hours as needed for headache or mild pain.    [provider]  losartan (COZAAR) 50 MG tablet Take 1 tablet (50 mg total) daily by mouth. 11/25/16   Lavera GuiseLiu, Kyani Simkin Duo, MD    Family History No family history on file.  Social History Social History   Tobacco Use  . Smoking status: Never Smoker  . Smokeless tobacco: Never Used  Substance Use Topics  . Alcohol use: No  . Drug use: No     Allergies   Patient has no known allergies.   Review of Systems Review of Systems  Constitutional: Negative for diaphoresis and fever.  Respiratory: Negative for cough and shortness of breath.   Cardiovascular: Positive for chest pain and palpitations. Negative for syncope and near-syncope.  Gastrointestinal: Negative for abdominal pain, nausea and vomiting.  Neurological: Positive for headaches.  All other systems reviewed and are negative.    Physical Exam Updated Vital Signs BP (!) 153/112  Pulse 78   Temp 97.6 F (36.4 C) (Oral)   Resp 15   LMP 11/25/2016   SpO2 99%   Physical Exam Physical Exam  Nursing note and vitals reviewed. Constitutional: Well developed, well nourished, non-toxic, and in no acute distress Head: Normocephalic and atraumatic.  Mouth/Throat: Oropharynx is clear and moist.  Neck: Normal range of motion. Neck supple.  Cardiovascular: Normal rate and regular rhythm.   Pulmonary/Chest: Effort normal and breath sounds  normal.  Abdominal: Soft. There is no tenderness. There is no rebound and no guarding.  Musculoskeletal: Normal range of motion.  Neurological: Alert, no facial droop, fluent speech, moves all extremities symmetrically Skin: Skin is warm and dry.  Psychiatric: Cooperative   ED Treatments / Results  Labs (all labs ordered are listed, but only abnormal results are displayed) Labs Reviewed  BASIC METABOLIC PANEL - Abnormal; Notable for the following components:      Result Value   Potassium 3.2 (*)    All other components within normal limits  CBC  I-STAT TROPONIN, ED    EKG  EKG Interpretation  Date/Time:  Wednesday November 25 2016 16:00:29 EST Ventricular Rate:  73 PR Interval:    QRS Duration: 88 QT Interval:  397 QTC Calculation: 438 R Axis:   25 Text Interpretation:  Sinus rhythm Borderline T abnormalities, diffuse leads no acute changes  Confirmed by Crista Curb [phone]) on 11/25/2016 4:20:37 PM       Radiology Dg Chest 2 View  Result Date: 11/25/2016 CLINICAL DATA:  Chest pain.  Hypertension. EXAM: CHEST  2 VIEW COMPARISON:  August 05, 2013 FINDINGS: Lungs are clear. Heart size and pulmonary vascularity are normal. No adenopathy. No pneumothorax. No bone lesions. IMPRESSION: No edema or consolidation. Electronically Signed   By: Bretta Bang III M.D.   On: 11/25/2016 14:17    Procedures Procedures (including critical care time)  Medications Ordered in ED Medications  losartan (COZAAR) tablet 50 mg (50 mg Oral Given 11/25/16 1713)     Initial Impression / Assessment and Plan / ED Course  I have reviewed the triage vital signs and the nursing notes.  Pertinent labs & imaging results that were available during my care of the patient were reviewed by me and considered in my medical decision making (see chart for details).     Patient is hypertensive in ED, due to her medication noncompliance.  However, description of her chest pain does not seem related to  her blood pressure and more related to stress.  Blood pressure is 170/100s in the, but currently asymptomatic.  Records are reviewed, and she was prescribed Cozaar 50 mg by her primary [facility].  She is given a dose of bad in the ED.  Her EKG is reassuring, without ischemic changes. Troponin negative. Doubt ACS, dissection, PE, or other serious cardiopulmonary processes.    Blood pressure improving in the ED, although still hypertensive.  She is asymptomatic at this time.  Appropriate for outpatient management.  She does have a PCP appointment tomorrow to discuss further blood pressure management. Strict return and follow-up instructions reviewed. She expressed understanding of all discharge instructions and felt comfortable with the plan of care.   Final Clinical Impressions(s) / ED Diagnoses   Final diagnoses:  Essential hypertension  Atypical chest pain    ED Discharge Orders        Ordered    losartan (COZAAR) 50 MG tablet  Daily     11/25/16 1758  Lavera GuiseLiu, Autym Siess Duo, MD 11/25/16 1800

## 2016-11-25 NOTE — Discharge Instructions (Signed)
Please start taking your blood pressure medications as prescribed.  Please see her primary care doctor tomorrow as scheduled.  Your workup today has been very reassuring.  Your heart workup also has been reassuring.  Please return without fail for worsening symptoms, including difficulty breathing, passing out, escalating pain, confusion, or any other symptoms concerning to you.

## 2016-12-24 ENCOUNTER — Encounter (HOSPITAL_COMMUNITY): Payer: Self-pay

## 2016-12-24 ENCOUNTER — Emergency Department (HOSPITAL_COMMUNITY)
Admission: EM | Admit: 2016-12-24 | Discharge: 2016-12-24 | Discharge status: Against Medical Advice | Payer: 59 | Attending: Emergency Medicine | Admitting: Emergency Medicine

## 2016-12-24 ENCOUNTER — Other Ambulatory Visit: Payer: Self-pay

## 2016-12-24 DIAGNOSIS — R109 Unspecified abdominal pain: Secondary | ICD-10-CM | POA: Insufficient documentation

## 2016-12-24 DIAGNOSIS — Z5321 Procedure and treatment not carried out due to patient leaving prior to being seen by health care provider: Secondary | ICD-10-CM | POA: Diagnosis not present

## 2016-12-24 HISTORY — DX: Essential (primary) hypertension: I10

## 2016-12-24 LAB — COMPREHENSIVE METABOLIC PANEL
ALBUMIN: 3.9 g/dL (ref 3.5–5.0)
ALT: 16 U/L (ref 14–54)
ANION GAP: 8 (ref 5–15)
AST: 25 U/L (ref 15–41)
Alkaline Phosphatase: 53 U/L (ref 38–126)
BUN: 6 mg/dL (ref 6–20)
CHLORIDE: 102 mmol/L (ref 101–111)
CO2: 26 mmol/L (ref 22–32)
Calcium: 9.2 mg/dL (ref 8.9–10.3)
Creatinine, Ser: 0.83 mg/dL (ref 0.44–1.00)
GFR calc Af Amer: >60 mL/min (ref >60)
GFR calc non Af Amer: >60 mL/min (ref >60)
GLUCOSE: 103 mg/dL — AB (ref 65–99)
POTASSIUM: 3.8 mmol/L (ref 3.5–5.1)
SODIUM: 136 mmol/L (ref 135–145)
TOTAL PROTEIN: 7.9 g/dL (ref 6.5–8.1)
Total Bilirubin: 0.6 mg/dL (ref 0.3–1.2)

## 2016-12-24 LAB — CBC
HEMATOCRIT: 35.7 % — AB (ref 36.0–46.0)
HEMOGLOBIN: 12.1 g/dL (ref 12.0–15.0)
MCH: 30.1 pg (ref 26.0–34.0)
MCHC: 33.9 g/dL (ref 30.0–36.0)
MCV: 88.8 fL (ref 78.0–100.0)
Platelets: 346 10*3/uL (ref 150–400)
RBC: 4.02 MIL/uL (ref 3.87–5.11)
RDW: 12.2 % (ref 11.5–15.5)
WBC: 5.1 10*3/uL (ref 4.0–10.5)

## 2016-12-24 LAB — LIPASE, BLOOD: Lipase: 35 U/L (ref 11–51)

## 2016-12-24 NOTE — ED Notes (Signed)
Pt stated that she wanted to leave because something came up. I advise pt to stay. However she stated that she will just come back another time.

## 2016-12-24 NOTE — ED Triage Notes (Signed)
Pt presents with 1 week h/o mid-abdominal pain that radiates into her chest.  Pt reports pain begins every morning at 0100 and lasts until 0700.  Pt describes as a cramping pain, "like contractions".  Pt denies any nausea,vomiting or diarrhea, denies any dysuria or vaginal discharge.  Last bowel movement was 2 days ago.

## 2017-01-16 ENCOUNTER — Encounter (HOSPITAL_COMMUNITY): Payer: Self-pay

## 2017-01-16 ENCOUNTER — Other Ambulatory Visit: Payer: Self-pay

## 2017-01-16 ENCOUNTER — Emergency Department (HOSPITAL_COMMUNITY)
Admission: EM | Admit: 2017-01-16 | Discharge: 2017-01-16 | Disposition: A | Discharge status: Home / Self Care | Payer: 59 | Attending: Emergency Medicine | Admitting: Emergency Medicine

## 2017-01-16 DIAGNOSIS — I10 Essential (primary) hypertension: Secondary | ICD-10-CM | POA: Insufficient documentation

## 2017-01-16 DIAGNOSIS — J020 Streptococcal pharyngitis: Secondary | ICD-10-CM | POA: Insufficient documentation

## 2017-01-16 DIAGNOSIS — Z79899 Other long term (current) drug therapy: Secondary | ICD-10-CM | POA: Insufficient documentation

## 2017-01-16 DIAGNOSIS — R0981 Nasal congestion: Secondary | ICD-10-CM | POA: Insufficient documentation

## 2017-01-16 DIAGNOSIS — R509 Fever, unspecified: Secondary | ICD-10-CM | POA: Insufficient documentation

## 2017-01-16 DIAGNOSIS — R05 Cough: Secondary | ICD-10-CM | POA: Insufficient documentation

## 2017-01-16 LAB — RAPID STREP SCREEN (MED CTR MEBANE ONLY): STREPTOCOCCUS, GROUP A SCREEN (DIRECT): POSITIVE — AB

## 2017-01-16 MED ORDER — PENICILLIN V POTASSIUM 500 MG PO TABS
500.0000 mg | ORAL_TABLET | Freq: Once | ORAL | Status: AC
  Administered 2017-01-16: 500 mg via ORAL
  Filled 2017-01-16: qty 1

## 2017-01-16 MED ORDER — PENICILLIN V POTASSIUM 500 MG PO TABS: 500.0000 mg | ORAL_TABLET | Freq: Two times a day (BID) | ORAL | 0 refills | Status: AC

## 2017-01-16 NOTE — ED Triage Notes (Signed)
PT C/O SORE THROAT, FEVER, AND DRY COUGH X2 DAYS.

## 2017-01-16 NOTE — ED Provider Notes (Signed)
[facility]-EMERGENCY DEPT Provider Note   CSN: [id number] Arrival date & time: 01/16/17  1255     History   Chief Complaint Chief Complaint  Patient presents with  . Sore Throat    HPI Melody Grant is a 32 y.o. female.  HPI  Patient is a 32 year old female with a history of essential hypertension (on losartan) presenting for sore throat for 2 days.  Patient reports that her throat began to feel scratchy at work 2 days ago.  Patient denies any dysphagia, dysphonia, or difficulty breathing or stridor.  Patient reports she had a tactile fever yesterday, however she did not measure it.   Patient has had a dry cough and some mild congestion, but no other symptoms. Patient has been taking Robitussin without relief of her symptoms.  Patient has a history of recurrent strep throat as an adult, however she did not have any sick contacts prior to the onset of this illness.  Past Medical History:  Diagnosis Date  . Hypertension     There are no active problems to display for this patient.   History reviewed. No pertinent surgical history.  OB History    No data available       Home Medications    Prior to Admission medications   Medication Sig Start Date End Date Taking? Authorizing Provider  guaiFENesin (ROBITUSSIN) 100 MG/5ML liquid Take 5-10 mLs (100-200 mg total) by mouth every 4 (four) hours as needed for cough. Patient not taking: Reported on 11/25/2016 08/05/13   Fayrene Helper, PA-C  guaiFENesin-codeine 100-10 MG/5ML syrup Take 5 mLs by mouth 3 (three) times daily as needed for cough. Patient not taking: Reported on 11/25/2016 08/18/15   Hedges, Tinnie Gens, PA-C  ibuprofen (ADVIL,MOTRIN) 200 MG tablet Take 400 mg by mouth every 6 (six) hours as needed for headache or mild pain.    [provider]  losartan (COZAAR) 50 MG tablet Take 1 tablet (50 mg total) daily by mouth. 11/25/16   Lavera Guise, MD    Family History History reviewed. No  pertinent family history.  Social History Social History   Tobacco Use  . Smoking status: Never Smoker  . Smokeless tobacco: Never Used  Substance Use Topics  . Alcohol use: No  . Drug use: No     Allergies   Patient has no known allergies.   Review of Systems Review of Systems  Constitutional: Positive for chills. Negative for appetite change.  HENT: Positive for congestion, rhinorrhea and sore throat. Negative for sinus pressure, sinus pain, trouble swallowing and voice change.   Respiratory: Positive for cough.   Musculoskeletal: Negative for myalgias.     Physical Exam Updated Vital Signs BP (!) 170/125 (BP Location: Left Arm)   Pulse (!) 105   Temp 98.2 F (36.8 C) (Oral)   Resp 20   Ht 5\' 3"  (1.6 m)   Wt 64.9 kg (143 lb)   LMP 01/11/2017   SpO2 100%   BMI 25.33 kg/m   Physical Exam  Constitutional: She appears well-developed and well-nourished. No distress.  Sitting comfortably in bed.  HENT:  Head: Normocephalic and atraumatic.  Mouth/Throat: Mucous membranes are normal. Tonsils are 1+ on the right. Tonsils are 1+ on the left.  Normal phonation. No muffled voice sounds. Patient swallows secretions without difficulty. Dentition normal. No lesions of tongue or buccal mucosa. Uvula midline. No asymmetric swelling of the posterior pharynx.+ Erythema of posterior pharynx. Minimal tonsillar exuduate. No lingual swelling. No induration  inferior to tongue. No submandibular tenderness, swelling, or induration.  Tissues of the neck supple. + cervical lymphadenopathy (submandibular).  Eyes: Conjunctivae are normal. Right eye exhibits no discharge. Left eye exhibits no discharge.  EOMs normal to gross examination.  Neck: Normal range of motion.  Cardiovascular: Normal rate and regular rhythm.  Intact, 2+ radial pulse.  No tachycardia.  Pulse 84 on my evaluation.  Pulmonary/Chest: Effort normal and breath sounds normal. No respiratory distress. She has no wheezes. She  has no rales.  Normal respiratory effort. Patient converses comfortably. No audible wheeze or stridor.  Abdominal: She exhibits no distension.  Musculoskeletal: Normal range of motion.  Neurological: She is alert.  Cranial nerves intact to gross observation. Patient moves extremities without difficulty.  Skin: Skin is warm and dry. She is not diaphoretic.  Psychiatric: She has a normal mood and affect. Her behavior is normal. Judgment and thought content normal.  Nursing note and vitals reviewed.    ED Treatments / Results  Labs (all labs ordered are listed, but only abnormal results are displayed) Labs Reviewed  RAPID STREP SCREEN (NOT AT [facility]) - Abnormal; Notable for the following components:      Result Value   Streptococcus, Group A Screen (Direct) POSITIVE (*)    All other components within normal limits    EKG  EKG Interpretation None       Radiology No results found.  Procedures Procedures (including critical care time)  Medications Ordered in ED Medications  penicillin v potassium (VEETID) tablet 500 mg (not administered)     Initial Impression / Assessment and Plan / ED Course  I have reviewed the triage vital signs and the nursing notes.  Pertinent labs & imaging results that were available during my care of the patient were reviewed by me and considered in my medical decision making (see chart for details).     Final Clinical Impressions(s) / ED Diagnoses   Final diagnoses:  Essential hypertension  Strep pharyngitis   Melody Grant is a 32 y.o. female who presents to ED for  sore throat. Patient nontoxic appearing and in no acute distress. Rapid strep positive. Presentation not concerning for PTA or infection spread to soft tissue. No trismus or uvula deviation. Patient with normal phonation. Exam demonstrates soft neck tissue, no swelling or induration inferior to the tongue or in the submandibular space  Treated in the ED with Penicillin VK. Rx for  course of Penicillin VK. Patient able to drink water in ED without difficulty with intact air way. Specific return precautions discussed for change in voice, inability to tolerate secretions, difficulty breathing or swallowing, or increased nausea or vomiting. Discussed importance of hydration. Recommended PCP follow up. All questions answered.  Of note, patient was tachycardic on initial evaluation.  Pulse was 84 on my evaluation.  Blood pressure elevated to 170/125 on initial evaluation.  Patient denies headache, blurred vision, chest pain, or shortness of breath.  Patient is on antihypertensives, and is followed by PCP for this.  I discussed this with patient, and she will follow-up for repeat evaluation.   ED Discharge Orders    None       Delia ChimesMurray, Miquel Stacks B, PA-C 01/16/17 1705    Delia ChimesMurray, Oluwatimileyin Vivier B, PA-C 01/16/17 1706    Rolland PorterJames, Mark, MD 01/16/17 2229

## 2017-01-16 NOTE — Discharge Instructions (Signed)
Please read and follow all provided instructions.  Your diagnoses today include:  1. Strep pharyngitis   2. Essential hypertension     Tests performed today include: Strep test: was POSITIVE for strep throat Vital signs. See below for your results today.   Medications prescribed:  You were given a dose of penicillin.  Please finish the entire course of antibiotics.  Please take all of your antibiotics until finished.   You may develop abdominal discomfort or nausea from the antibiotic. If this occurs, you may take it with food. Some patients also get diarrhea with antibiotics. You may help offset this with probiotics which you can buy or get in yogurt. Do not eat or take the probiotics until 2 hours after your antibiotic. Some women develop vaginal yeast infections after antibiotics. If you develop unusual vaginal discharge after being on this medication, please see your primary care provider.   Some people develop allergies to antibiotics. Symptoms of antibiotic allergy can be mild and include a flat rash and itching. They can also be more serious and include:  ?Hives - Hives are raised, red patches of skin that are usually very itchy.  ?Lip or tongue swelling  ?Trouble swallowing or breathing  ?Blistering of the skin or mouth.  If you have any of these serious symptoms, please seek emergency medical care immediately.  Take any medications prescribed only as directed.   You may alternate ibuprofen and Tylenol for your throat pain.  He may take 600 mg of ibuprofen every 6 hours and 650 mg of Tylenol every 6 hours.  Home care instructions:  Please read the educational materials provided and follow any instructions contained in this packet.  Follow-up instructions: Please follow-up with your primary care provider as needed for further evaluation of your symptoms.  Return instructions:  Please return to the Emergency Department if you experience worsening symptoms.  Return if you  have worsening problems swallowing, your neck becomes swollen, you cannot swallow your saliva or your voice becomes muffled.  Return with high persistent fever, persistent vomiting, or if you have trouble breathing.  Please return if you have any other emergent concerns.  Additional Information:  Your vital signs today were: BP (!) 170/125 (BP Location: Left Arm)    Pulse (!) 105    Temp 98.2 F (36.8 C) (Oral)    Resp 20    Ht 5\' 3"  (1.6 m)    Wt 64.9 kg (143 lb)    LMP 01/11/2017    SpO2 100%    BMI 25.33 kg/m  If your blood pressure (BP) was elevated above 135/85 this visit, please have this repeated by your doctor within one month.  Your blood pressure is elevated today.  Please have this reevaluated by her primary care provider.

## 2017-01-22 ENCOUNTER — Emergency Department (HOSPITAL_COMMUNITY)
Admission: EM | Admit: 2017-01-22 | Discharge: 2017-01-22 | Disposition: A | Discharge status: Home / Self Care | Payer: 59 | Attending: Emergency Medicine | Admitting: Emergency Medicine

## 2017-01-22 ENCOUNTER — Other Ambulatory Visit: Payer: Self-pay

## 2017-01-22 DIAGNOSIS — M21612 Bunion of left foot: Secondary | ICD-10-CM | POA: Insufficient documentation

## 2017-01-22 DIAGNOSIS — I10 Essential (primary) hypertension: Secondary | ICD-10-CM | POA: Insufficient documentation

## 2017-01-22 DIAGNOSIS — R2242 Localized swelling, mass and lump, left lower limb: Secondary | ICD-10-CM | POA: Insufficient documentation

## 2017-01-22 DIAGNOSIS — Z79899 Other long term (current) drug therapy: Secondary | ICD-10-CM | POA: Insufficient documentation

## 2017-01-22 NOTE — Discharge Instructions (Signed)
Please use shoes with wide-based soles.  He can also take NSAIDs like ibuprofen every 6 hours for pain.  Please also ice the area of pain on your foot.  Your blood pressure was elevated in the ER today, please have this rechecked at your primary care office.  I have listed the information below to a podiatrist in [place].  Please call if you continue to have pain for further evaluation.  Return to the emergency department if you notice worsening swelling and redness in the left foot.  Please also return if you have fever greater than 100.4 F with foot pain.  You can also return for any new or worsening symptoms.

## 2017-01-22 NOTE — ED Notes (Signed)
See EDP secondary assessment.  

## 2017-01-22 NOTE — ED Provider Notes (Signed)
[facility] EMERGENCY DEPARTMENT Provider Note   CSN: [id number] Arrival date & time: 01/22/17  4098     History   Chief Complaint Chief Complaint  Patient presents with  . Foot Pain    HPI Melody Grant is a 32 y.o. female.  HPI   Melody Grant is a 32yo female with a history of hypertension who presents to the emergency department for evaluation of left foot pain.  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  Noticed about 4 days ago that she had some swelling over the base of the left big toe with associated pain.  Denies inciting injury.  Pain is 10/10 in severity and worsened when she touches the area of swelling.  Reports that she has been using ibuprofen for her pain without significant relief.  Denies redness, break in skin, fever, numbness, weakness.  She is able to ambulate independently although painful.  Past Medical History:  Diagnosis Date  . Hypertension     There are no active problems to display for this patient.   No past surgical history on file.  OB History    No data available       Home Medications    Prior to Admission medications   Medication Sig Start Date End Date Taking? Authorizing Provider  guaiFENesin (ROBITUSSIN) 100 MG/5ML liquid Take 5-10 mLs (100-200 mg total) by mouth every 4 (four) hours as needed for cough. Patient not taking: Reported on 11/25/2016 08/05/13   Fayrene Helper, PA-C  guaiFENesin-codeine 100-10 MG/5ML syrup Take 5 mLs by mouth 3 (three) times daily as needed for cough. Patient not taking: Reported on 11/25/2016 08/18/15   Hedges, Tinnie Gens, PA-C  ibuprofen (ADVIL,MOTRIN) 200 MG tablet Take 400 mg by mouth every 6 (six) hours as needed for headache or mild pain.    [provider]  losartan (COZAAR) 50 MG tablet Take 1 tablet (50 mg total) daily by mouth. 11/25/16   Lavera Guise, MD  penicillin v potassium (VEETID) 500 MG tablet Take 1 tablet (500 mg total) by mouth 2  (two) times daily for 10 days. 01/16/17 01/26/17  Elisha Ponder, PA-C    Family History No family history on file.  Social History Social History   Tobacco Use  . Smoking status: Never Smoker  . Smokeless tobacco: Never Used  Substance Use Topics  . Alcohol use: No  . Drug use: No     Allergies   Patient has no known allergies.   Review of Systems Review of Systems  Constitutional: Negative for chills, fatigue and fever.  Musculoskeletal: Positive for arthralgias (left foot) and joint swelling. Negative for gait problem.  Skin: Negative for color change and wound.  Neurological: Negative for weakness and numbness.     Physical Exam Updated Vital Signs BP (!) 140/108   Pulse 70   Temp 98.7 F (37.1 C) (Oral)   Resp 16   LMP 01/11/2017   SpO2 100%   Physical Exam  Constitutional: She appears well-developed and well-nourished. No distress.  HENT:  Head: Normocephalic and atraumatic.  Eyes: Right eye exhibits no discharge. Left eye exhibits no discharge.  Pulmonary/Chest: Effort normal. No respiratory distress.  Musculoskeletal:  Hallux valgus noted on the left foot. Overlying tenderness of the first MTP joint. No break in skin.  No erythema or warmth.  Full ROM of left ankle and toes.  Sensation intact to light touch in all 5 toes  of the left foot.  DP pulses 2+ bilaterally.  Capillary refill less than 2 seconds.  Neurological: She is alert. Coordination normal.  Skin: Skin is warm and dry. Capillary refill takes less than 2 seconds. She is not diaphoretic.  Psychiatric: She has a normal mood and affect. Her behavior is normal.  Nursing note and vitals reviewed.    ED Treatments / Results  Labs (all labs ordered are listed, but only abnormal results are displayed) Labs Reviewed - No data to display  EKG  EKG Interpretation None       Radiology No results found.  Procedures Procedures (including critical care time)  Medications Ordered in  ED Medications - No data to display   Initial Impression / Assessment and Plan / ED Course  I have reviewed the triage vital signs and the nursing notes.  Pertinent labs & imaging results that were available during my care of the patient were reviewed by me and considered in my medical decision making (see chart for details).    Patient with painful bunion on left foot.  Left foot neurovascularly intact on exam.  No erythema, warmth or signs of infection.  Have counseled patient on conservative management including wide shoes, NSAIDs and RICE protocol.  No recent injury, do not believe that x-ray is necessary at this time.  Discussed this with patient who agrees.  We will give her information to follow-up with podiatry.  Her blood pressure was elevated in the ER today, counseled her to have this rechecked.  Patient agrees and has no complaints prior to discharge.  Final Clinical Impressions(s) / ED Diagnoses   Final diagnoses:  Bunion of great toe of left foot    ED Discharge Orders    None       Lawrence MarseillesShrosbree, Sajjad Honea J, PA-C 01/22/17 1408    Cathren LaineSteinl, Kevin, MD 01/22/17 1544

## 2017-01-22 NOTE — ED Triage Notes (Signed)
Pt has sore place on outside of left foot. No open wound, pt has red area on ball of foot.

## 2017-10-15 ENCOUNTER — Emergency Department (HOSPITAL_COMMUNITY): Payer: 59

## 2017-10-15 ENCOUNTER — Encounter (HOSPITAL_COMMUNITY): Payer: Self-pay

## 2017-10-15 ENCOUNTER — Emergency Department (HOSPITAL_COMMUNITY)
Admission: EM | Admit: 2017-10-15 | Discharge: 2017-10-15 | Disposition: A | Discharge status: Home / Self Care | Payer: 59 | Attending: Emergency Medicine | Admitting: Emergency Medicine

## 2017-10-15 ENCOUNTER — Other Ambulatory Visit: Payer: Self-pay

## 2017-10-15 DIAGNOSIS — Z79899 Other long term (current) drug therapy: Secondary | ICD-10-CM | POA: Insufficient documentation

## 2017-10-15 DIAGNOSIS — R51 Headache: Secondary | ICD-10-CM

## 2017-10-15 DIAGNOSIS — R519 Headache, unspecified: Secondary | ICD-10-CM

## 2017-10-15 DIAGNOSIS — I10 Essential (primary) hypertension: Secondary | ICD-10-CM | POA: Insufficient documentation

## 2017-10-15 LAB — CBC
HEMATOCRIT: 38.1 % (ref 36.0–46.0)
Hemoglobin: 12.4 g/dL (ref 12.0–15.0)
MCH: 30.4 pg (ref 26.0–34.0)
MCHC: 32.5 g/dL (ref 30.0–36.0)
MCV: 93.4 fL (ref 78.0–100.0)
Platelets: 332 10*3/uL (ref 150–400)
RBC: 4.08 MIL/uL (ref 3.87–5.11)
RDW: 11.9 % (ref 11.5–15.5)
WBC: 4.5 10*3/uL (ref 4.0–10.5)

## 2017-10-15 LAB — CBG MONITORING, ED: Glucose-Capillary: 97 mg/dL (ref 70–99)

## 2017-10-15 LAB — BASIC METABOLIC PANEL
Anion gap: 9 (ref 5–15)
BUN: 7 mg/dL (ref 6–20)
CHLORIDE: 106 mmol/L (ref 98–111)
CO2: 23 mmol/L (ref 22–32)
CREATININE: 0.72 mg/dL (ref 0.44–1.00)
Calcium: 9.1 mg/dL (ref 8.9–10.3)
Glucose, Bld: 104 mg/dL — ABNORMAL HIGH (ref 70–99)
POTASSIUM: 3.6 mmol/L (ref 3.5–5.1)
SODIUM: 138 mmol/L (ref 135–145)

## 2017-10-15 LAB — I-STAT TROPONIN, ED: Troponin i, poc: 0 ng/mL (ref 0.00–0.08)

## 2017-10-15 LAB — I-STAT BETA HCG BLOOD, ED (MC, WL, AP ONLY)

## 2017-10-15 MED ORDER — METOCLOPRAMIDE HCL 5 MG/ML IJ SOLN
10.0000 mg | Freq: Once | INTRAMUSCULAR | Status: AC
  Administered 2017-10-15: 10 mg via INTRAVENOUS
  Filled 2017-10-15: qty 2

## 2017-10-15 MED ORDER — LOSARTAN POTASSIUM 50 MG PO TABS: 50.0000 mg | ORAL_TABLET | Freq: Every day | ORAL | 1 refills | Status: AC

## 2017-10-15 MED ORDER — LOSARTAN POTASSIUM 50 MG PO TABS
50.0000 mg | ORAL_TABLET | Freq: Once | ORAL | Status: AC
  Administered 2017-10-15: 50 mg via ORAL
  Filled 2017-10-15 (×2): qty 1

## 2017-10-15 MED ORDER — DIPHENHYDRAMINE HCL 50 MG/ML IJ SOLN
25.0000 mg | Freq: Once | INTRAMUSCULAR | Status: AC
  Administered 2017-10-15: 25 mg via INTRAVENOUS
  Filled 2017-10-15: qty 1

## 2017-10-15 NOTE — ED Provider Notes (Signed)
[facility] EMERGENCY DEPARTMENT Provider Note   CSN: [id number] Arrival date & time: 10/15/17  4098     History   Chief Complaint Chief Complaint  Patient presents with  . Loss of Consciousness    HPI Melody Grant is a 32 y.o. female.  Patient with history of hypertension, noncompliant with medications presents with complaint of headache, chest pain, syncope this morning.  Patient states that she had a flag football practice yesterday where she was exerting herself.  She states that after the practice she developed some mild mid chest pain that does not radiate.  Last night she developed a headache.  She denies any head trauma at her practice.  She has not had any vomiting or neck pain.  No fevers.  This morning patient was driving to work and was having difficulty driving straight in the lane and actually had to stop.  Her friend drove the rest of the way and states that the patient was "out of it".  Unclear if she ever lost consciousness completely.  Patient does get headaches sometimes but are not typical for her.  Otherwise she reports feeling tired.  No abdominal pain, nausea, vomiting, diarrhea.  The onset of this condition was acute. The course is constant. Aggravating factors: none. Alleviating factors: none.       Past Medical History:  Diagnosis Date  . Hypertension     There are no active problems to display for this patient.   History reviewed. No pertinent surgical history.   OB History   None      Home Medications    Prior to Admission medications   Medication Sig Start Date End Date Taking? Authorizing Provider  guaiFENesin (ROBITUSSIN) 100 MG/5ML liquid Take 5-10 mLs (100-200 mg total) by mouth every 4 (four) hours as needed for cough. Patient not taking: Reported on 11/25/2016 08/05/13   Fayrene Helper, PA-C  guaiFENesin-codeine 100-10 MG/5ML syrup Take 5 mLs by mouth 3 (three) times daily as needed for cough. Patient not taking:  Reported on 11/25/2016 08/18/15   Hedges, Tinnie Gens, PA-C  ibuprofen (ADVIL,MOTRIN) 200 MG tablet Take 400 mg by mouth every 6 (six) hours as needed for headache or mild pain.    [provider]  losartan (COZAAR) 50 MG tablet Take 1 tablet (50 mg total) daily by mouth. 11/25/16   Lavera Guise, MD    Family History No family history on file.  Social History Social History   Tobacco Use  . Smoking status: Never Smoker  . Smokeless tobacco: Never Used  Substance Use Topics  . Alcohol use: No  . Drug use: No     Allergies   Patient has no known allergies.   Review of Systems Review of Systems  Constitutional: Positive for fatigue. Negative for diaphoresis and fever.  HENT: Negative for congestion, dental problem, rhinorrhea, sinus pressure and sore throat.   Eyes: Negative for photophobia, discharge, redness and visual disturbance.  Respiratory: Negative for cough and shortness of breath.   Cardiovascular: Positive for chest pain. Negative for palpitations and leg swelling.  Gastrointestinal: Negative for abdominal pain, diarrhea, nausea and vomiting.  Genitourinary: Positive for vaginal bleeding. Negative for dysuria, pelvic pain and vaginal discharge.  Musculoskeletal: Negative for back pain, gait problem, myalgias, neck pain and neck stiffness.  Skin: Negative for rash.  Neurological: Positive for syncope and headaches. Negative for speech difficulty, weakness, light-headedness and numbness.  Psychiatric/Behavioral: Negative for confusion. The patient is not nervous/anxious.  Physical Exam Updated Vital Signs BP (!) 173/117 (BP Location: Right Arm)   Pulse 79   Temp 98.9 F (37.2 C) (Oral)   Resp 16   LMP 10/01/2017 (Approximate)   SpO2 100%   Physical Exam  Constitutional: She is oriented to person, place, and time. She appears well-developed and well-nourished.  HENT:  Head: Normocephalic and atraumatic.  Right Ear: Tympanic membrane, external ear and  ear canal normal.  Left Ear: Tympanic membrane, external ear and ear canal normal.  Nose: Nose normal.  Mouth/Throat: Uvula is midline, oropharynx is clear and moist and mucous membranes are normal. Mucous membranes are not dry.  Eyes: Pupils are equal, round, and reactive to light. Conjunctivae, EOM and lids are normal. Right eye exhibits no nystagmus. Left eye exhibits no nystagmus.  Neck: Trachea normal and normal range of motion. Neck supple. Normal carotid pulses and no JVD present. No muscular tenderness present. Carotid bruit is not present. No tracheal deviation present.  Cardiovascular: Normal rate, regular rhythm, S1 normal, S2 normal, normal heart sounds and intact distal pulses. Exam reveals no decreased pulses.  No murmur heard. Pulmonary/Chest: Effort normal and breath sounds normal. No respiratory distress. She has no wheezes. She exhibits no tenderness.  Abdominal: Soft. Normal aorta and bowel sounds are normal. There is no tenderness. There is no rebound and no guarding.  Musculoskeletal: Normal range of motion.       Cervical back: She exhibits normal range of motion, no tenderness and no bony tenderness.  Neurological: She is alert and oriented to person, place, and time. She has normal strength and normal reflexes. No cranial nerve deficit or sensory deficit. She displays a negative Romberg sign. Coordination and gait normal. GCS eye subscore is 4. GCS verbal subscore is 5. GCS motor subscore is 6.  Skin: Skin is warm and dry. She is not diaphoretic. No cyanosis. No pallor.  Psychiatric: She has a normal mood and affect.  Nursing note and vitals reviewed.    ED Treatments / Results  Labs (all labs ordered are listed, but only abnormal results are displayed) Labs Reviewed  BASIC METABOLIC PANEL - Abnormal; Notable for the following components:      Result Value   Glucose, Bld 104 (*)    All other components within normal limits  CBC  CBG MONITORING, ED  I-STAT BETA HCG  BLOOD, ED ([facility], [facility], [facility] ONLY)  I-STAT TROPONIN, ED   ED ECG REPORT   Date: 10/15/2017  Rate: 67  Rhythm: normal sinus rhythm  QRS Axis: normal  Intervals: normal  ST/T Wave abnormalities: nonspecific T wave changes  Conduction Disutrbances:none  Narrative Interpretation: No signs of Brugada syndrome, WPW, prolonged QT  Old EKG Reviewed: unchanged  I have personally reviewed the EKG tracing and agree with the computerized printout as noted.  Radiology Ct Head Wo Contrast  Result Date: 10/15/2017 CLINICAL DATA:  Right temporal headache. Hypertension. Syncopal episode. EXAM: CT HEAD WITHOUT CONTRAST TECHNIQUE: Contiguous axial images were obtained from the base of the skull through the vertex without intravenous contrast. COMPARISON:  None. FINDINGS: Brain: The brain shows a normal appearance without evidence of malformation, atrophy, old or acute small or large vessel infarction, mass lesion, hemorrhage, hydrocephalus or extra-axial collection. Vascular: No hyperdense vessel. No evidence of atherosclerotic calcification. Skull: Normal.  No traumatic finding.  No focal bone lesion. Sinuses/Orbits: Sinuses are clear. Orbits appear normal. Mastoids are clear. Other: None significant IMPRESSION: Normal head CT Electronically Signed   By: Paulina Fusi  M.D.   On: 10/15/2017 09:16    Procedures Procedures (including critical care time)  Medications Ordered in ED Medications  metoCLOPramide (REGLAN) injection 10 mg (10 mg Intravenous Given 10/15/17 1052)  diphenhydrAMINE (BENADRYL) injection 25 mg (25 mg Intravenous Given 10/15/17 1051)  losartan (COZAAR) tablet 50 mg (50 mg Oral Given 10/15/17 1054)     Initial Impression / Assessment and Plan / ED Course  I have reviewed the triage vital signs and the nursing notes.  Pertinent labs & imaging results that were available during my care of the patient were reviewed by me and considered in my medical decision making (see chart for details).       Patient seen and examined. EKG reviewed. Non-specific t-wave changed noted, unchanged from previous. No other concerning findings.   Vital signs reviewed and are as follows: BP (!) 173/117 (BP Location: Right Arm)   Pulse 79   Temp 98.9 F (37.2 C) (Oral)   Resp 16   LMP 10/01/2017 (Approximate)   SpO2 100%    8:56 AM will treat headache.  No signs of meningismus on exam.  Given syncope in setting of somewhat atypical headache and high blood pressure, will check for signs of bleeding on noncontrast head CT.  Will reassess.  No focal neurological deficits at time of exam.  10:42 AM Pt updated and rechecked. Awaiting HA and BP treatment.   She mentions that she is having some vaginal bleeding over the past 2 days. Reports period went off last week. No pelvic pain or discharge. Preg neg, hgb normal. Pt states she will f/u with her GYN for this.   12:38 PM patient reassessed and she is feeling better.  She is comfortable discharged home at this time.  We discussed again her imaging and blood test results.  Discussed importance of following up with her primary care doctor to continue to manage her blood pressure.  Patient counseled to return if they have weakness in their arms or legs, slurred speech, trouble walking or talking, confusion, trouble with their balance, or if they have any other concerns. Patient verbalizes understanding and agrees with plan.    Final Clinical Impressions(s) / ED Diagnoses   Final diagnoses:  Acute nonintractable headache, unspecified headache type  Hypertension, unspecified type   HA: Patient without high-risk features of headache including: sudden onset/thunderclap HA, no similar headache in past, altered mental status, accompanying seizure, headache with exertion, age > 40, history of immunocompromise, neck or shoulder pain, fever, use of anticoagulation, family history of spontaneous SAH, concomitant drug use, toxic exposure. Given reported syncope, CT  ordered and is negative for acute abnormality. History is not suggestive of occult sentinel bleed.   Patient has a normal complete neurological exam, normal vital signs, normal level of consciousness, no signs of meningismus, is well-appearing/non-toxic appearing, no signs of trauma.   No dangerous or life-threatening conditions suspected or identified by history, physical exam, and by work-up. No indications for hospitalization identified.  Patient is not confused.  Unclear if she had a full syncopal episode today or if she was just being affected by her headache.  She continues to do well in the emergency department.  HTN: Patient noncompliant with medications.  She will be restarted on losartan.  Strongly encouraged follow-up with primary care for for continued management.  No other signs of endorgan damage.  EKG is reassuring and troponin is negative.  Normal kidney function.   ED Discharge Orders  Ordered    losartan (COZAAR) 50 MG tablet  Daily     10/15/17 1237           Renne Crigler, PA-C 10/15/17 1241    Wynetta Fines, MD 10/16/17 1153

## 2017-10-15 NOTE — ED Triage Notes (Signed)
Pt presents for evaluation of syncopal episode today. Pt reports was not feeling well this morning, was on the way to job as Engineer, materials. Driver of car with girlfriend as passenger, pt pulled over & had syncopal event. Pt reports hx of hypertension, has not been on medication x 2 weeks due to running out. Pt reports ongoing feelings of "fatigue."

## 2017-10-15 NOTE — ED Notes (Signed)
Pt stable, ambulatory, states understanding of discharge instructions 

## 2017-10-15 NOTE — Discharge Instructions (Signed)
Please read and follow all provided instructions.  Your diagnoses today include:  1. Acute nonintractable headache, unspecified headache type   2. Hypertension, unspecified type     Tests performed today include:  CT of your head which was normal and did not show any serious cause of your headache  Blood counts and electrolytes  EKG and blood test for heart attack  Vital signs. See below for your results today.   Medications:  In the Emergency Department you received:  Reglan - antinausea/headache medication  Benadryl - antihistamine to counteract potential side effects of reglan  Take any prescribed medications only as directed.  Additional information:  Follow any educational materials contained in this packet.  You are having a headache. No specific cause was found today for your headache. It may have been a migraine or other cause of headache. Stress, anxiety, fatigue, and depression are common triggers for headaches.   Your headache today does not appear to be life-threatening or require hospitalization, but often the exact cause of headaches is not determined in the emergency department. Therefore, follow-up with your doctor is very important to find out what may have caused your headache and whether or not you need any further diagnostic testing or treatment.   Sometimes headaches can appear benign (not harmful), but then more serious symptoms can develop which should prompt an immediate re-evaluation by your doctor or the emergency department.  BE VERY CAREFUL not to take multiple medicines containing Tylenol (also called acetaminophen). Doing so can lead to an overdose which can damage your liver and cause liver failure and possibly death.   Follow-up instructions: Please follow-up with your primary care provider in the next 3 days for further evaluation of your symptoms.   Return instructions:   Please return to the Emergency Department if you experience worsening  symptoms.  Return if the medications do not resolve your headache, if it recurs, or if you have multiple episodes of vomiting or cannot keep down fluids.  Return if you have a change from the usual headache.  RETURN IMMEDIATELY IF you:  Develop a sudden, severe headache  Develop confusion or become poorly responsive or faint  Develop a fever above 100.87F or problem breathing  Have a change in speech, vision, swallowing, or understanding  Develop new weakness, numbness, tingling, incoordination in your arms or legs  Have a seizure  Please return if you have any other emergent concerns.  Additional Information:  Your vital signs today were: BP (!) 164/102    Pulse 62    Temp 98.9 F (37.2 C) (Oral)    Resp 14    LMP 10/01/2017 (Approximate)    SpO2 100%  If your blood pressure (BP) was elevated above 135/85 this visit, please have this repeated by your doctor within one month. --------------

## 2018-02-23 ENCOUNTER — Emergency Department (HOSPITAL_COMMUNITY): Payer: Medicaid Other

## 2018-02-23 ENCOUNTER — Emergency Department (HOSPITAL_COMMUNITY)
Admission: EM | Admit: 2018-02-23 | Discharge: 2018-02-23 | Disposition: A | Discharge status: Home / Self Care | Payer: Medicaid Other | Attending: Emergency Medicine | Admitting: Emergency Medicine

## 2018-02-23 ENCOUNTER — Encounter (HOSPITAL_COMMUNITY): Payer: Self-pay | Admitting: Emergency Medicine

## 2018-02-23 DIAGNOSIS — Z79899 Other long term (current) drug therapy: Secondary | ICD-10-CM | POA: Diagnosis not present

## 2018-02-23 DIAGNOSIS — I10 Essential (primary) hypertension: Secondary | ICD-10-CM | POA: Insufficient documentation

## 2018-02-23 DIAGNOSIS — R079 Chest pain, unspecified: Secondary | ICD-10-CM | POA: Diagnosis present

## 2018-02-23 DIAGNOSIS — R0789 Other chest pain: Secondary | ICD-10-CM | POA: Diagnosis not present

## 2018-02-23 LAB — I-STAT BETA HCG BLOOD, ED (NOT ORDERABLE): I-stat hCG, quantitative: <5 m[IU]/mL (ref <5)

## 2018-02-23 LAB — CBC
HCT: 38.2 % (ref 36.0–46.0)
Hemoglobin: 12.7 g/dL (ref 12.0–15.0)
MCH: 31.1 pg (ref 26.0–34.0)
MCHC: 33.2 g/dL (ref 30.0–36.0)
MCV: 93.4 fL (ref 80.0–100.0)
PLATELETS: 304 10*3/uL (ref 150–400)
RBC: 4.09 MIL/uL (ref 3.87–5.11)
RDW: 11.7 % (ref 11.5–15.5)
WBC: 3.4 10*3/uL — ABNORMAL LOW (ref 4.0–10.5)
nRBC: 0 % (ref 0.0–0.2)

## 2018-02-23 LAB — BASIC METABOLIC PANEL
Anion gap: 8 (ref 5–15)
BUN: 9 mg/dL (ref 6–20)
CALCIUM: 8.9 mg/dL (ref 8.9–10.3)
CO2: 25 mmol/L (ref 22–32)
Chloride: 103 mmol/L (ref 98–111)
Creatinine, Ser: 0.83 mg/dL (ref 0.44–1.00)
GFR calc Af Amer: >60 mL/min (ref >60)
GFR calc non Af Amer: >60 mL/min (ref >60)
Glucose, Bld: 98 mg/dL (ref 70–99)
Potassium: 3.7 mmol/L (ref 3.5–5.1)
Sodium: 136 mmol/L (ref 135–145)

## 2018-02-23 LAB — POCT I-STAT TROPONIN I: Troponin i, poc: 0 ng/mL (ref 0.00–0.08)

## 2018-02-23 MED ORDER — IBUPROFEN 200 MG PO TABS
600.0000 mg | ORAL_TABLET | Freq: Once | ORAL | Status: AC
  Administered 2018-02-23: 600 mg via ORAL
  Filled 2018-02-23: qty 3

## 2018-02-23 MED ORDER — SODIUM CHLORIDE 0.9% FLUSH: 3.0000 mL | Freq: Once | INTRAVENOUS | Status: DC

## 2018-02-23 NOTE — Discharge Instructions (Addendum)
All your laboratory studies were within normal limits today. Please follow up with your primary care physician as needed. You may alternate ibuprofen or tylenol for you pain.

## 2018-02-23 NOTE — ED Triage Notes (Signed)
Patient here from home with complaints of right sided chest pain x1 week. Denies n/v.

## 2018-02-23 NOTE — ED Provider Notes (Signed)
[facility]-EMERGENCY DEPT Provider Note   CSN: [id number] Arrival date & time: 02/23/18  1247     History   Chief Complaint Chief Complaint  Patient presents with  . Chest Pain    HPI Melody Grant is a 33 y.o. female.  33 y.o female with a PMH of HTN presents to the ED with a chief complaint of chest pain x 1 week. Patient describes her pain as constant tight located on the right side of her chest, worse with deep breathing and movement but better by lying on the left side. She rates her pain a 8/10. No medical therapy has been tried for improvement in symptoms. Patient denies any nausea/vomiting, abdominal pain, fever or shortness of breath. She denies any smoking or birth control use.   The history is provided by the patient.  Chest Pain  Pain location:  R chest Pain quality: tightness   Pain radiates to:  Does not radiate Pain severity:  Moderate Onset quality:  Gradual Duration:  7 days Timing:  Constant Progression:  Unchanged Associated symptoms: no abdominal pain, no back pain, no cough, no fever, no palpitations, no shortness of breath and no vomiting   Risk factors: hypertension   Risk factors: no birth control, no diabetes mellitus, no prior DVT/PE and no smoking     Past Medical History:  Diagnosis Date  . Hypertension     There are no active problems to display for this patient.   History reviewed. No pertinent surgical history.   OB History   No obstetric history on file.      Home Medications    Prior to Admission medications   Medication Sig Start Date End Date Taking? Authorizing Provider  losartan (COZAAR) 50 MG tablet Take 1 tablet (50 mg total) by mouth daily. 10/15/17  Yes Renne Crigler, PA-C    Family History No family history on file.  Social History Social History   Tobacco Use  . Smoking status: Never Smoker  . Smokeless tobacco: Never Used  Substance Use Topics  . Alcohol use: No  . Drug use: No      Allergies   Patient has no known allergies.   Review of Systems Review of Systems  Constitutional: Negative for chills and fever.  HENT: Negative for ear pain and sore throat.   Eyes: Negative for pain and visual disturbance.  Respiratory: Negative for cough and shortness of breath.   Cardiovascular: Positive for chest pain. Negative for palpitations.  Gastrointestinal: Negative for abdominal pain and vomiting.  Genitourinary: Negative for dysuria and hematuria.  Musculoskeletal: Negative for arthralgias and back pain.  Skin: Negative for color change and rash.  Neurological: Negative for seizures and syncope.  All other systems reviewed and are negative.    Physical Exam Updated Vital Signs BP (!) 147/106   Pulse 80   Temp 98 F (36.7 C) (Oral)   Resp 18   SpO2 100%   Physical Exam Vitals signs and nursing note reviewed.  Constitutional:      General: She is not in acute distress.    Appearance: She is well-developed.  HENT:     Head: Normocephalic and atraumatic.     Mouth/Throat:     Pharynx: No oropharyngeal exudate.  Eyes:     Pupils: Pupils are equal, round, and reactive to light.  Neck:     Musculoskeletal: Normal range of motion.  Cardiovascular:     Rate and Rhythm: Normal rate and regular rhythm.  Heart sounds: Normal heart sounds.     Comments: No swelling to bilateral legs. No pitting edema.  Pulmonary:     Effort: Pulmonary effort is normal. No respiratory distress.     Breath sounds: Normal breath sounds. No decreased breath sounds or wheezing.  Abdominal:     General: Bowel sounds are normal. There is no distension.     Palpations: Abdomen is soft.     Tenderness: There is no abdominal tenderness.  Musculoskeletal:        General: No tenderness or deformity.     Right lower leg: No edema.     Left lower leg: No edema.  Skin:    General: Skin is warm and dry.  Neurological:     Mental Status: She is alert and oriented to person,  place, and time.      ED Treatments / Results  Labs (all labs ordered are listed, but only abnormal results are displayed) Labs Reviewed  CBC - Abnormal; Notable for the following components:      Result Value   WBC 3.4 (*)    All other components within normal limits  BASIC METABOLIC PANEL  I-STAT BETA HCG BLOOD, ED ([facility], [facility], [facility] ONLY)  I-STAT BETA HCG BLOOD, ED (NOT ORDERABLE)  I-STAT TROPONIN, ED  POCT I-STAT TROPONIN I    EKG EKG Interpretation  Date/Time:  Wednesday February 23 2018 12:56:05 EST Ventricular Rate:  77 PR Interval:    QRS Duration: 91 QT Interval:  395 QTC Calculation: 447 R Axis:   45 Text Interpretation:  Sinus rhythm Borderline T abnormalities, diffuse leads since last tracing no significant change Confirmed by Mancel Bale [id number]) on 02/23/2018 3:05:59 PM   Radiology Dg Chest 2 View  Result Date: 02/23/2018 CLINICAL DATA:  Right-sided chest pain for 1 week. EXAM: CHEST - 2 VIEW COMPARISON:  None. FINDINGS: The heart size and mediastinal contours are within normal limits. Both lungs are clear. The visualized skeletal structures are unremarkable. IMPRESSION: Normal study.  No active cardiopulmonary disease. Electronically Signed   By: Myles Rosenthal M.D.   On: 02/23/2018 13:37    Procedures Procedures (including critical care time)  Medications Ordered in ED Medications  sodium chloride flush (NS) 0.9 % injection 3 mL (3 mLs Intravenous Not Given 02/23/18 1900)  ibuprofen (ADVIL,MOTRIN) tablet 600 mg (600 mg Oral Given 02/23/18 1758)     Initial Impression / Assessment and Plan / ED Course  I have reviewed the triage vital signs and the nursing notes.  Pertinent labs & imaging results that were available during my care of the patient were reviewed by me and considered in my medical decision making (see chart for details).    Patient with a PMH of HTN presents to the ED with a chief complaint of chest pain x 1 week . Patient ha snot tried any  therapy for relieve in symptoms. During evaluation, patient is well appearing vital signs are stable, slight elevation of blood pressure but reports PCP currently manages it losartan, reports compliance.  Patient reports no smoking use, no birth control, no tachycardia or hypoxia, low suspisicion for PE.  BMP showed no electrolyte abnormality, CBC showed decrease in white blood cell count at 3.4, hemoglobin is within normal limits.  hCG was negative.  Chest x-ray showed: no acute process.   8:50 PM Patient reports she is pain free after ibuprofen, low suspicion for ACS as patient has no previous cardiac history, does not smoke and is low risk factors.  Heart score is 0, will discharge patient to follow up with PCP as needed. Return precautions discussed at length.   Final Clinical Impressions(s) / ED Diagnoses   Final diagnoses:  Atypical chest pain    ED Discharge Orders    None       Claude MangesSoto, Lonzo Saulter, Cordelia Poche-C 02/23/18 2052    Charlynne PanderYao, David Hsienta, MD 02/23/18 [phone]

## 2018-04-26 ENCOUNTER — Ambulatory Visit: Payer: Medicaid Other | Admitting: Podiatry

## 2018-04-26 ENCOUNTER — Ambulatory Visit (INDEPENDENT_AMBULATORY_CARE_PROVIDER_SITE_OTHER): Payer: Medicaid Other

## 2018-04-26 ENCOUNTER — Encounter: Payer: Self-pay | Admitting: Podiatry

## 2018-04-26 ENCOUNTER — Other Ambulatory Visit: Payer: Self-pay

## 2018-04-26 VITALS — BP 156/105 | HR 72 | Temp 97.2°F | Resp 16

## 2018-04-26 DIAGNOSIS — M2011 Hallux valgus (acquired), right foot: Secondary | ICD-10-CM

## 2018-04-26 DIAGNOSIS — M2012 Hallux valgus (acquired), left foot: Secondary | ICD-10-CM

## 2018-04-26 DIAGNOSIS — Z8679 Personal history of other diseases of the circulatory system: Secondary | ICD-10-CM | POA: Insufficient documentation

## 2018-04-26 NOTE — Patient Instructions (Signed)
Pre-Operative Instructions  Congratulations, you have decided to take an important step towards improving your quality of life.  You can be assured that the doctors and staff at [facility] will be with you every step of the way.  Here are some important things you should know:  1. Plan to be at the surgery center/hospital at least 1 (one) hour prior to your scheduled time, unless otherwise directed by the surgical center/hospital staff.  You must have a responsible adult accompany you, remain during the surgery and drive you home.  Make sure you have directions to the surgical center/hospital to ensure you arrive on time. 2. If you are having surgery at [facility] or [facility], you will need a copy of your medical history and physical form from your family physician within one month prior to the date of surgery. We will give you a form for your primary physician to complete.  3. We make every effort to accommodate the date you request for surgery.  However, there are times where surgery dates or times have to be moved.  We will contact you as soon as possible if a change in schedule is required.   4. No aspirin/ibuprofen for one week before surgery.  If you are on aspirin, any non-steroidal anti-inflammatory medications (Mobic, Aleve, Ibuprofen) should not be taken seven (7) days prior to your surgery.  You make take Tylenol for pain prior to surgery.  5. Medications - If you are taking daily heart and blood pressure medications, seizure, reflux, allergy, asthma, anxiety, pain or diabetes medications, make sure you notify the surgery center/hospital before the day of surgery so they can tell you which medications you should take or avoid the day of surgery. 6. No food or drink after midnight the night before surgery unless directed otherwise by surgical center/hospital staff. 7. No alcoholic beverages 24-hours prior to surgery.  No smoking 24-hours prior or 24-hours after  surgery. 8. Wear loose pants or shorts. They should be loose enough to fit over bandages, boots, and casts. 9. Don't wear slip-on shoes. Sneakers are preferred. 10. Bring your boot with you to the surgery center/hospital.  Also bring crutches or a walker if your physician has prescribed it for you.  If you do not have this equipment, it will be provided for you after surgery. 11. If you have not been contacted by the surgery center/hospital by the day before your surgery, call to confirm the date and time of your surgery. 12. Leave-time from work may vary depending on the type of surgery you have.  Appropriate arrangements should be made prior to surgery with your employer. 13. Prescriptions will be provided immediately following surgery by your doctor.  Fill these as soon as possible after surgery and take the medication as directed. Pain medications will not be refilled on weekends and must be approved by the doctor. 14. Remove nail polish on the operative foot and avoid getting pedicures prior to surgery. 15. Wash the night before surgery.  The night before surgery wash the foot and leg well with water and the antibacterial soap provided. Be sure to pay special attention to beneath the toenails and in between the toes.  Wash for at least three (3) minutes. Rinse thoroughly with water and dry well with a towel.  Perform this wash unless told not to do so by your physician.  Enclosed: 1 Ice pack (please put in freezer the night before surgery)   1 Hibiclens skin cleaner     Pre-op instructions  If you have any questions regarding the instructions, please do not hesitate to call our office.  [place]: [address], , [place] [address] -- [phone]  [place]: [address] [place], [place] [address] -- [phone]  [place]: [address]  [place], [place] [address] -- [phone]  [place]: [address], [address], [place], [place] [address] -- [phone]  Website:  [email or web address] 

## 2018-04-26 NOTE — Progress Notes (Signed)
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 female presents with the above complaint.   ROS: Denies fever chills nausea vomiting muscle aches pains calf pain back pain chest pain shortness of breath.  Past Medical History:  Diagnosis Date   Hypertension    No past surgical history on file.  Current Outpatient Medications:    losartan (COZAAR) 50 MG tablet, Take 1 tablet (50 mg total) by mouth daily., Disp: 30 tablet, Rfl: 1  No Known Allergies Review of Systems Objective:   Vitals:   04/26/18 0901  BP: (!) 156/105  Pulse: 72  Resp: 16  Temp: (!) 97.2 F (36.2 C)    General: Well developed, nourished, in no acute distress, alert and oriented x3   Dermatological: Skin is warm, dry and supple bilateral. Nails x 10 are well maintained; remaining integument appears unremarkable at this time. There are no open sores, no preulcerative lesions, no rash or signs of infection present.  Vascular: Dorsalis Pedis artery and Posterior Tibial artery pedal pulses are 2/4 bilateral with immedate capillary fill time. Pedal hair growth present. No varicosities and no lower extremity edema present bilateral.   Neruologic: Grossly intact via light touch bilateral. Vibratory intact via tuning fork bilateral. Protective threshold with Semmes Wienstein monofilament intact to all pedal sites bilateral. Patellar and Achilles deep tendon reflexes 2+ bilateral. No Babinski or clonus noted bilateral.   Musculoskeletal: No gross boney pedal deformities bilateral. No pain, crepitus, or limitation noted with foot and ankle range of motion bilateral. Muscular  strength 5/5 in all groups tested bilateral.  Hallux abductovalgus deformity with painful range of motion though does not demonstrate any crepitation and very minimal limitation on range of motion.  Majority of the pain is dorsal lateral aspect of the metatarsal phalangeal joint at its dislocation site.  Gait: Unassisted, Nonantalgic.    Radiographs:  Radiographs taken today demonstrate increase in the first intermetatarsal angle greater than normal value with hallux abductus angle greater than normal value in early dislocation particular in the left foot.  Mild dorsal spurring is noted.  There is hypertrophic bone growth to the medial aspect of the first metatarsal head.  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  Plan: Discussed etiology pathology conservative versus surgical therapies.  Due to the chronic pain and increased angular deformity and dislocation of this joint we are going to recommend surgical correction at some point.  We went over the consent form today line by line number by number giving her ample time to ask questions she saw fit regarding a Austin bunionectomy with screw fixation and a Akin osteotomy with screw fixation.  She understands this is amenable to it we did discuss the possible postop complications which may include but not limited to postop pain bleeding swelling infection recurrence need for further surgery overcorrection under correction also digit loss of limb loss of life.  We provided her with information regarding the surgery center and anesthesia today we also provided her with information and products for the morning of surgery.  She understands  this and is amenable to it follow-up with her in the near future for surgical intervention.     Tanise Russman T. [place]ainesvilleHyatt, [place] Dakota[place]PM

## 2018-06-07 ENCOUNTER — Telehealth: Payer: Self-pay | Admitting: Podiatry

## 2018-06-07 NOTE — Telephone Encounter (Signed)
Pt called saying she missed a phone call and wanted to know if it was from [place]? Pt wanted to make sure everything was still a go for her surgery on 05 June. I told the pt it could have been [facility] that called and gave her their number to contact them.

## 2018-06-15 ENCOUNTER — Other Ambulatory Visit: Payer: Self-pay | Admitting: Podiatry

## 2018-06-15 MED ORDER — OXYCODONE-ACETAMINOPHEN 10-325 MG PO TABS: 1.0000 | ORAL_TABLET | Freq: Four times a day (QID) | ORAL | 0 refills | Status: AC | PRN

## 2018-06-15 MED ORDER — ONDANSETRON HCL 4 MG PO TABS: 4.0000 mg | ORAL_TABLET | Freq: Three times a day (TID) | ORAL | 0 refills | Status: DC | PRN

## 2018-06-15 MED ORDER — CEPHALEXIN 500 MG PO CAPS: 500.0000 mg | ORAL_CAPSULE | Freq: Three times a day (TID) | ORAL | 0 refills | Status: DC

## 2018-06-17 DIAGNOSIS — M2012 Hallux valgus (acquired), left foot: Secondary | ICD-10-CM

## 2018-06-17 DIAGNOSIS — M2022 Hallux rigidus, left foot: Secondary | ICD-10-CM | POA: Diagnosis not present

## 2018-06-23 ENCOUNTER — Ambulatory Visit (INDEPENDENT_AMBULATORY_CARE_PROVIDER_SITE_OTHER): Payer: Medicaid Other | Admitting: Podiatry

## 2018-06-23 ENCOUNTER — Encounter: Payer: Self-pay | Admitting: Podiatry

## 2018-06-23 ENCOUNTER — Ambulatory Visit (INDEPENDENT_AMBULATORY_CARE_PROVIDER_SITE_OTHER): Payer: Medicaid Other

## 2018-06-23 ENCOUNTER — Other Ambulatory Visit: Payer: Self-pay

## 2018-06-23 VITALS — BP 157/108 | HR 84 | Temp 97.3°F | Resp 16

## 2018-06-23 DIAGNOSIS — M2012 Hallux valgus (acquired), left foot: Secondary | ICD-10-CM | POA: Diagnosis not present

## 2018-06-23 DIAGNOSIS — Z9889 Other specified postprocedural states: Secondary | ICD-10-CM

## 2018-06-23 NOTE — Progress Notes (Signed)
She presents today for her first postop visit date of surgery 06/17/2018 [facility] bunionectomy with an Akin osteotomy on the left foot.  She denies fever chills nausea vomiting muscle aches pain states that the first 3 days were exquisitely tender but have improved considerably since.  To continue to work and walk on a regular basis.  She denies any calf pain chest pain or headache.  She denies any shortness of breath.  Objective: Presents today ambulating in her cam walker.  Once the cam walker was removed vessel dressing was intact once removed demonstrates minimal edema no erythema cellulitis drainage odor toe is rectus she has good range of motion passively and actively.  Radiographs taken today demonstrate an [facility] bunion repair with screw fixation and a Akin osteotomy with cerclage wire.  Assessment: Well-healing surgical foot x1 week.  Plan: Redressed today dressed a compressive dressing encourage range of motion exercises follow-up with her in about 1 week or so.

## 2018-06-30 ENCOUNTER — Other Ambulatory Visit: Payer: Self-pay

## 2018-06-30 ENCOUNTER — Ambulatory Visit (INDEPENDENT_AMBULATORY_CARE_PROVIDER_SITE_OTHER): Payer: Medicaid Other | Admitting: Podiatry

## 2018-06-30 VITALS — Temp 97.8°F

## 2018-06-30 DIAGNOSIS — Z9889 Other specified postprocedural states: Secondary | ICD-10-CM

## 2018-06-30 NOTE — Progress Notes (Signed)
She presents today for second postop visit date of surgery 06/17/2018 [facility] bunionectomy left with an Akin osteotomy left.  States that my foot is feeling fine.  Objective: Vital signs are stable she is alert and oriented x3 she has stiffness on range of motion passively and actively first metatarsal phalangeal joint.  Assessment: Well-healing surgical foot no signs of infection.  Plan: Redressed today dressed a compressive dressing placed her in a Darco shoe and a compression anklet.

## 2018-07-14 ENCOUNTER — Other Ambulatory Visit: Payer: Self-pay

## 2018-07-14 ENCOUNTER — Ambulatory Visit (INDEPENDENT_AMBULATORY_CARE_PROVIDER_SITE_OTHER): Payer: Medicaid Other | Admitting: Podiatry

## 2018-07-14 ENCOUNTER — Ambulatory Visit (INDEPENDENT_AMBULATORY_CARE_PROVIDER_SITE_OTHER): Payer: Medicaid Other

## 2018-07-14 DIAGNOSIS — M2012 Hallux valgus (acquired), left foot: Secondary | ICD-10-CM | POA: Diagnosis not present

## 2018-07-14 DIAGNOSIS — Z9889 Other specified postprocedural states: Secondary | ICD-10-CM

## 2018-07-14 NOTE — Progress Notes (Signed)
She presents today for postop visit date of surgery 06/17/2018 status post [facility] bunionectomy left with a Akin osteotomy states my foot feels great I have no pain whatsoever.  She denies fever chills nausea vomiting muscle aches pains calf pain back pain chest pain shortness of breath.  Objective: Vital signs are stable alert and oriented x3.  There is no erythema edema cellulitis drainage or odor.  She has great range of motion of the first metatarsophalangeal joint the toe is rectus.  She has some swelling maybe around the proximal phalanx.  But minimal.  Radiographs taken today demonstrating osseously mature individual with a healing first metatarsal osteotomy and a slightly widened diastases of the Akin osteotomy in the proximal phalanx.  This may just be from change in angular angles on the radiograph but it does appear to be a little more gapped open than it was last time.  Laterally it is healing and I expect that to move over immediately.  Assessment: Well-healing surgical foot.  Plan: Put her in a compression anklet for any incidental swelling put her in her back in her Darco shoe and I will follow-up with her in 3 weeks for another set of x-rays

## 2018-07-24 ENCOUNTER — Other Ambulatory Visit: Payer: Self-pay

## 2018-07-24 ENCOUNTER — Emergency Department (HOSPITAL_COMMUNITY)
Admission: EM | Admit: 2018-07-24 | Discharge: 2018-07-24 | Disposition: A | Discharge status: Home / Self Care | Payer: Medicaid Other | Attending: Emergency Medicine | Admitting: Emergency Medicine

## 2018-07-24 ENCOUNTER — Emergency Department (HOSPITAL_COMMUNITY): Payer: Medicaid Other

## 2018-07-24 DIAGNOSIS — Z79899 Other long term (current) drug therapy: Secondary | ICD-10-CM | POA: Diagnosis not present

## 2018-07-24 DIAGNOSIS — R079 Chest pain, unspecified: Secondary | ICD-10-CM | POA: Diagnosis not present

## 2018-07-24 DIAGNOSIS — I10 Essential (primary) hypertension: Secondary | ICD-10-CM | POA: Diagnosis not present

## 2018-07-24 LAB — CBC
HCT: 37.1 % (ref 36.0–46.0)
Hemoglobin: 12.6 g/dL (ref 12.0–15.0)
MCH: 30.9 pg (ref 26.0–34.0)
MCHC: 34 g/dL (ref 30.0–36.0)
MCV: 90.9 fL (ref 80.0–100.0)
Platelets: 296 10*3/uL (ref 150–400)
RBC: 4.08 MIL/uL (ref 3.87–5.11)
RDW: 11.7 % (ref 11.5–15.5)
WBC: 3.4 10*3/uL — ABNORMAL LOW (ref 4.0–10.5)
nRBC: 0 % (ref 0.0–0.2)

## 2018-07-24 LAB — BASIC METABOLIC PANEL
Anion gap: 8 (ref 5–15)
BUN: 7 mg/dL (ref 6–20)
CO2: 24 mmol/L (ref 22–32)
Calcium: 9.2 mg/dL (ref 8.9–10.3)
Chloride: 106 mmol/L (ref 98–111)
Creatinine, Ser: 0.77 mg/dL (ref 0.44–1.00)
GFR calc Af Amer: >60 mL/min (ref >60)
GFR calc non Af Amer: >60 mL/min (ref >60)
Glucose, Bld: 101 mg/dL — ABNORMAL HIGH (ref 70–99)
Potassium: 3.6 mmol/L (ref 3.5–5.1)
Sodium: 138 mmol/L (ref 135–145)

## 2018-07-24 LAB — I-STAT BETA HCG BLOOD, ED (MC, WL, AP ONLY): I-stat hCG, quantitative: <5 m[IU]/mL (ref <5)

## 2018-07-24 LAB — CBG MONITORING, ED: Glucose-Capillary: 88 mg/dL (ref 70–99)

## 2018-07-24 LAB — D-DIMER, QUANTITATIVE: D-Dimer, Quant: 0.28 ug/mL-FEU (ref 0.00–0.50)

## 2018-07-24 LAB — TROPONIN I (HIGH SENSITIVITY)
Troponin I (High Sensitivity): 3 ng/L (ref <18)
Troponin I (High Sensitivity): <2 ng/L (ref <18)

## 2018-07-24 MED ORDER — HYDRALAZINE HCL 20 MG/ML IJ SOLN
10.0000 mg | Freq: Once | INTRAMUSCULAR | Status: AC
  Administered 2018-07-24: 10 mg via INTRAVENOUS
  Filled 2018-07-24: qty 1

## 2018-07-24 MED ORDER — ASPIRIN 81 MG PO CHEW
324.0000 mg | CHEWABLE_TABLET | Freq: Once | ORAL | Status: AC
  Administered 2018-07-24: 324 mg via ORAL
  Filled 2018-07-24: qty 4

## 2018-07-24 NOTE — Discharge Instructions (Signed)
You were seen in the ED today for chest pain; your workup was very reassuring today and your chest pain improved while you were in the ED. You did have high blood pressure when you came in which was treated - please follow up with your PCP regarding this. Return to the ED for any worsening symptoms.

## 2018-07-24 NOTE — ED Triage Notes (Signed)
Pt is 10 days post op foot surgery. She has been having centralized chest pain since last night with tightness when taking a deep breath, however pain is also worse with movement of the neck.

## 2018-07-24 NOTE — ED Provider Notes (Signed)
[facility] Note   CSN: [id number] Arrival date & time: 07/24/18  1115    History   Chief Complaint No chief complaint on file.   HPI Melody Grant is a 33 y.o. female with PMHx HTN on losartan who presents to the ED today complaining of sudden onset, constant, tight, substernal chest pain that began yesterday around 11:30 AM (approximately 24 hours ago). The pain is exacerbated with deep inspiration and movement. She reports that when she attempts to move her neck the pain in her chest worsens. No specific neck pain. Pt took 400 mg Ibuprofen last night without relief. She states she has had chest pain in the past but it is typically intermittent in nature and has never been constant. Pt recently had a bunionectomy on 06/05 by Dr. Al CorpusHyatt without any complications. No hx DVT or PE in the past. No recent prolonged travel. States she has tried to be active since her surgery and denies laying down for prolonged periods after the surgery. Pt is no on estrogen therapy. No hemoptysis. No active malignancy. Denies fever, chills, shortness of breath, abdominal pain, nausea, vomiting, headache, vision changes, neck stiffness, rash, leg swelling, or any other associated symptoms.        Past Medical History:  Diagnosis Date  . Hypertension     Patient Active Problem List   Diagnosis Date Noted  . History of hypertension 04/26/2018  . Atypical endocervical cells on cervical Papanicolaou smear 08/05/2016    No past surgical history on file.   OB History   No obstetric history on file.      Home Medications    Prior to Admission medications   Medication Sig Start Date End Date Taking? Authorizing Provider  cephALEXin (KEFLEX) 500 MG capsule Take 1 capsule (500 mg total) by mouth 3 (three) times daily. 06/15/18   Hyatt, Max T, DPM  losartan (COZAAR) 50 MG tablet Take 1 tablet (50 mg total) by mouth daily. 10/15/17   Renne CriglerGeiple, Joshua, PA-C   ondansetron (ZOFRAN) 4 MG tablet Take 1 tablet (4 mg total) by mouth every 8 (eight) hours as needed for nausea or vomiting. 06/15/18   Hyatt, Max T, DPM    Family History No family history on file.  Social History Social History   Tobacco Use  . Smoking status: Never Smoker  . Smokeless tobacco: Never Used  Substance Use Topics  . Alcohol use: No  . Drug use: No     Allergies   Patient has no known allergies.   Review of Systems Review of Systems  Constitutional: Negative for chills and fever.  HENT: Negative for congestion.   Eyes: Negative for visual disturbance.  Respiratory: Negative for cough and shortness of breath.   Cardiovascular: Positive for chest pain. Negative for palpitations and leg swelling.  Gastrointestinal: Negative for abdominal pain, nausea and vomiting.  Genitourinary: Negative for dysuria and frequency.  Musculoskeletal: Negative for back pain and neck stiffness.  Skin: Negative for rash.  Neurological: Negative for syncope and headaches.     Physical Exam Updated Vital Signs BP (!) 179/105 (BP Location: Right Arm)   Pulse 60   Temp 98.6 F (37 C) (Oral)   Resp 18   SpO2 100%   Physical Exam Vitals signs and nursing note reviewed.  Constitutional:      Comments: Uncomfortable appearing female  HENT:     Head: Normocephalic and atraumatic.  Eyes:     Conjunctiva/sclera: Conjunctivae normal.  Neck:     Musculoskeletal: Neck supple. No neck rigidity or muscular tenderness.  Cardiovascular:     Rate and Rhythm: Normal rate and regular rhythm.     Pulses: Normal pulses.     Comments: 2+ radial and DP pulses bilaterally Pulmonary:     Effort: Pulmonary effort is normal.     Breath sounds: Normal breath sounds. No wheezing, rhonchi or rales.     Comments: Mild tenderness to sternum with palpation; no crepitus Chest:     Chest wall: Tenderness present.  Abdominal:     Palpations: Abdomen is soft.     Tenderness: There is no abdominal  tenderness. There is no guarding or rebound.  Musculoskeletal:        General: No swelling.     Right lower leg: No edema.     Left lower leg: No edema.  Skin:    General: Skin is warm and dry.  Neurological:     Mental Status: She is alert.      ED Treatments / Results  Labs (all labs ordered are listed, but only abnormal results are displayed) Labs Reviewed  BASIC METABOLIC PANEL - Abnormal; Notable for the following components:      Result Value   Glucose, Bld 101 (*)    All other components within normal limits  CBC - Abnormal; Notable for the following components:   WBC 3.4 (*)    All other components within normal limits  D-DIMER, QUANTITATIVE (NOT AT [facility])  CBG MONITORING, ED  I-STAT BETA HCG BLOOD, ED ([facility], [facility], [facility] ONLY)  TROPONIN I (HIGH SENSITIVITY)  TROPONIN I (HIGH SENSITIVITY)    EKG EKG Interpretation  Date/Time:  Sunday July 24 2018 11:27:13 EDT Ventricular Rate:  78 PR Interval:    QRS Duration: 91 QT Interval:  418 QTC Calculation: 467 R Axis:   58 Text Interpretation:  Sinus rhythm Borderline T abnormalities, diffuse leads Confirmed by Kristine RoyalMessick, Peter [phone]([id number]) on 07/24/2018 11:39:54 AM   Radiology Dg Chest Portable 1 View  Result Date: 07/24/2018 CLINICAL DATA:  Chest pain and pleuritic chest pain beginning yesterday. Ten days postop from foot surgery. EXAM: PORTABLE CHEST 1 VIEW COMPARISON:  02/23/2018 FINDINGS: The heart size and mediastinal contours are within normal limits. Both lungs are clear. The visualized skeletal structures are unremarkable. IMPRESSION: Normal exam.  No active disease. Electronically Signed   By: Danae OrleansJohn A Stahl M.D.   On: 07/24/2018 12:16    Procedures Procedures (including critical care time)  Medications Ordered in ED Medications  aspirin chewable tablet 324 mg (324 mg Oral Given 07/24/18 1153)  hydrALAZINE (APRESOLINE) injection 10 mg (10 mg Intravenous Given 07/24/18 1237)     Initial Impression / Assessment and Plan  / ED Course  I have reviewed the triage vital signs and the nursing notes.  Pertinent labs & imaging results that were available during my care of the patient were reviewed by me and considered in my medical decision making (see chart for details).    33 year old female presenting to the ED today complaining of pleuritic chest pain x 1 day. Had bunionectomy 06/05 without issues; denies being more immobile since surgery. No recent overuse injury to suggest musculoskeletal pain. Concern for PE given nature of pain and hx of surgery. No previous hx of DVT/PE. No estrogen therapy. No other risk factors. Pt nontachy on exam. Will get d dimer today as well as baseline ACS rule out labwork. EKG without ischemic changes compared to previous. 324  mg ASA given for pain. Pt hypertensive in the ED today at 179/105; reports she ran out of her Losartan this morning and awaiting approval from her PCP for new prescription.   Labwork reassuring today. CXR without signs of pulmonary concerns. Initial trop negative. D dimer negative as well; do not feel a CTA is necessary at this time. Will proceed with repeat trop for reassurance; expected to be elevated with a PE as well as a D-dimer. Will also ambulate patient around the ED with pulse ox to determine if she has any desaturations.  Per nursing note; pt able to ambulate with O2 sat 100% continuously. Feel as if her chest pain has improved. Will discharge home at this time. Bloodpressure has stabilized with IV hydralazine; 145/98 most recently.        Final Clinical Impressions(s) / ED Diagnoses   Final diagnoses:  Chest pain, unspecified type  Essential hypertension    ED Discharge Orders    None       Eustaquio Maize, Hershal Coria 07/24/18 2158    Valarie Merino, MD 07/25/18 1058

## 2018-07-24 NOTE — ED Triage Notes (Signed)
C/oo midsternal CP starting yesterday, constant pain without precipitating factors.  While resting she states she can't move her head to look at me without pain to chest.  Denies any neck pain or SOB.  Denies cough, fever or chills.  Pt states she has been exercising and had normal activity.  No N/V.  No noted increased muscle strain.

## 2018-07-24 NOTE — ED Notes (Signed)
Patient Alert and oriented to baseline. Stable and ambulatory to baseline. Patient verbalized understanding of the discharge instructions.  Patient belongings were taken by the patient.   

## 2018-07-24 NOTE — ED Notes (Signed)
Pt ambulated around yellow zone twice no SOB, Cp, and no dizzy. Pt stated she feel a little better she able to sit up. Pt stated she wasn't able to sit up before. Pt o2 was 100% when walking and Hr was 93 to 100 when walking, pt back in  Room at this time

## 2018-07-28 ENCOUNTER — Other Ambulatory Visit: Payer: Medicaid Other

## 2018-08-04 ENCOUNTER — Encounter: Payer: Self-pay | Admitting: Podiatry

## 2018-08-04 ENCOUNTER — Other Ambulatory Visit: Payer: Self-pay

## 2018-08-04 ENCOUNTER — Ambulatory Visit (INDEPENDENT_AMBULATORY_CARE_PROVIDER_SITE_OTHER): Payer: Medicaid Other

## 2018-08-04 ENCOUNTER — Ambulatory Visit (INDEPENDENT_AMBULATORY_CARE_PROVIDER_SITE_OTHER): Payer: Self-pay | Admitting: Podiatry

## 2018-08-04 VITALS — Temp 98.3°F

## 2018-08-04 DIAGNOSIS — Z9889 Other specified postprocedural states: Secondary | ICD-10-CM

## 2018-08-04 DIAGNOSIS — M2012 Hallux valgus (acquired), left foot: Secondary | ICD-10-CM

## 2018-08-04 NOTE — Progress Notes (Signed)
She presents today for postop visit date of surgery 06/17/2018 status post [facility] bunionectomy left with an Akin osteotomy.  She states that she is doing a lot better she has no pain just some swelling occasionally.  Objective: Vital signs are stable she is alert and oriented x3 left first metatarsophalangeal joint is rectus she has great range of motion.  Mild tenderness on attempted range of motion of the proximal phalanx and on palpation.  Radiographs taken today demonstrate Akin osteotomy has not healed 100% but the capital osteotomy of the first metatarsal appears to be healing very nicely with internal fixation in good position.  Assessment: Well-healing surgical foot.  Plan: Follow-up with me in 1 month if necessary.

## 2018-08-11 ENCOUNTER — Telehealth: Payer: Self-pay | Admitting: *Deleted

## 2018-08-11 NOTE — Telephone Encounter (Signed)
Shelda Altes states pt needs a note from Dr. Milinda Pointer.

## 2018-08-12 ENCOUNTER — Encounter: Payer: Self-pay | Admitting: Podiatry

## 2018-09-06 ENCOUNTER — Encounter: Payer: Medicaid Other | Admitting: Podiatry

## 2018-09-08 ENCOUNTER — Ambulatory Visit (INDEPENDENT_AMBULATORY_CARE_PROVIDER_SITE_OTHER): Payer: Medicaid Other | Admitting: Podiatry

## 2018-09-08 ENCOUNTER — Encounter: Payer: Self-pay | Admitting: Podiatry

## 2018-09-08 ENCOUNTER — Other Ambulatory Visit: Payer: Self-pay

## 2018-09-08 ENCOUNTER — Ambulatory Visit (INDEPENDENT_AMBULATORY_CARE_PROVIDER_SITE_OTHER): Payer: Medicaid Other

## 2018-09-08 DIAGNOSIS — Z9889 Other specified postprocedural states: Secondary | ICD-10-CM

## 2018-09-08 DIAGNOSIS — M2012 Hallux valgus (acquired), left foot: Secondary | ICD-10-CM

## 2018-09-08 NOTE — Progress Notes (Signed)
She presents today date of surgery June 17, 2018 status post [facility] bunionectomy and Akin osteotomy left foot.  States that is doing well.  Objective: Vital signs are stable alert and oriented x3.  Pulses are palpable.  She has great range of motion of the first metatarsal phalangeal joint there is no edema erythema cellulitis drainage or odor mildly hypertrophic scar distalmost aspect overlying the proximal phalanx.  Nontender.  Radiographs taken today demonstrate a well-healed Akin osteotomy and Austin bunion repair.  No acute abnormalities.  Assessment: Well-healing surgical foot.  Plan: I will allow her to get back to work light duty at the beginning progressing over the next month to full duty.  I will follow-up with her in a month.

## 2018-10-06 ENCOUNTER — Ambulatory Visit: Payer: Medicaid Other

## 2018-10-06 ENCOUNTER — Encounter: Payer: Medicaid Other | Admitting: Podiatry

## 2018-10-06 DIAGNOSIS — M2012 Hallux valgus (acquired), left foot: Secondary | ICD-10-CM

## 2018-10-06 NOTE — Progress Notes (Signed)
This encounter was created in error - please disregard.

## 2018-10-18 ENCOUNTER — Ambulatory Visit: Payer: Medicaid Other

## 2018-10-18 ENCOUNTER — Encounter: Payer: Medicaid Other | Admitting: Podiatry

## 2018-10-18 NOTE — Progress Notes (Signed)
This encounter was created in error - please disregard.

## 2019-03-07 ENCOUNTER — Other Ambulatory Visit: Payer: Self-pay

## 2019-03-07 ENCOUNTER — Ambulatory Visit (INDEPENDENT_AMBULATORY_CARE_PROVIDER_SITE_OTHER): Payer: Medicaid Other

## 2019-03-07 ENCOUNTER — Ambulatory Visit (INDEPENDENT_AMBULATORY_CARE_PROVIDER_SITE_OTHER): Payer: Medicaid Other | Admitting: Podiatry

## 2019-03-07 ENCOUNTER — Encounter: Payer: Self-pay | Admitting: Podiatry

## 2019-03-07 DIAGNOSIS — M7752 Other enthesopathy of left foot: Secondary | ICD-10-CM | POA: Diagnosis not present

## 2019-03-07 DIAGNOSIS — M7751 Other enthesopathy of right foot: Secondary | ICD-10-CM

## 2019-03-07 DIAGNOSIS — M778 Other enthesopathies, not elsewhere classified: Secondary | ICD-10-CM

## 2019-03-07 DIAGNOSIS — M2011 Hallux valgus (acquired), right foot: Secondary | ICD-10-CM

## 2019-03-07 MED ORDER — MELOXICAM 15 MG PO TABS: 15.0000 mg | ORAL_TABLET | Freq: Every day | ORAL | 3 refills | Status: AC

## 2019-03-07 NOTE — Patient Instructions (Signed)
Pre-Operative Instructions  Congratulations, you have decided to take an important step towards improving your quality of life.  You can be assured that the doctors and staff at [facility] will be with you every step of the way.  Here are some important things you should know:  1. Plan to be at the surgery center/hospital at least 1 (one) hour prior to your scheduled time, unless otherwise directed by the surgical center/hospital staff.  You must have a responsible adult accompany you, remain during the surgery and drive you home.  Make sure you have directions to the surgical center/hospital to ensure you arrive on time. 2. If you are having surgery at [facility] or [facility] hospitals, you will need a copy of your medical history and physical form from your family physician within one month prior to the date of surgery. We will give you a form for your primary physician to complete.  3. We make every effort to accommodate the date you request for surgery.  However, there are times where surgery dates or times have to be moved.  We will contact you as soon as possible if a change in schedule is required.   4. No aspirin/ibuprofen for one week before surgery.  If you are on aspirin, any non-steroidal anti-inflammatory medications (Mobic, Aleve, Ibuprofen) should not be taken seven (7) days prior to your surgery.  You make take Tylenol for pain prior to surgery.  5. Medications - If you are taking daily heart and blood pressure medications, seizure, reflux, allergy, asthma, anxiety, pain or diabetes medications, make sure you notify the surgery center/hospital before the day of surgery so they can tell you which medications you should take or avoid the day of surgery. 6. No food or drink after midnight the night before surgery unless directed otherwise by surgical center/hospital staff. 7. No alcoholic beverages 24-hours prior to surgery.  No smoking 24-hours prior or 24-hours after  surgery. 8. Wear loose pants or shorts. They should be loose enough to fit over bandages, boots, and casts. 9. Don't wear slip-on shoes. Sneakers are preferred. 10. Bring your boot with you to the surgery center/hospital.  Also bring crutches or a walker if your physician has prescribed it for you.  If you do not have this equipment, it will be provided for you after surgery. 11. If you have not been contacted by the surgery center/hospital by the day before your surgery, call to confirm the date and time of your surgery. 12. Leave-time from work may vary depending on the type of surgery you have.  Appropriate arrangements should be made prior to surgery with your employer. 13. Prescriptions will be provided immediately following surgery by your doctor.  Fill these as soon as possible after surgery and take the medication as directed. Pain medications will not be refilled on weekends and must be approved by the doctor. 14. Remove nail polish on the operative foot and avoid getting pedicures prior to surgery. 15. Wash the night before surgery.  The night before surgery wash the foot and leg well with water and the antibacterial soap provided. Be sure to pay special attention to beneath the toenails and in between the toes.  Wash for at least three (3) minutes. Rinse thoroughly with water and dry well with a towel.  Perform this wash unless told not to do so by your physician.  Enclosed: 1 Ice pack (please put in freezer the night before surgery)   1 Hibiclens skin cleaner     Pre-op instructions  If you have any questions regarding the instructions, please do not hesitate to call our office.  [place]: [address], [place], [place] [address] -- [phone]  [place]: [address] [place], [place] [address] -- [phone]  [place]: [address], [place], [place] [address] -- [phone]   Website: [email or web address] 

## 2019-03-08 NOTE — Progress Notes (Signed)
She presents today first metatarsophalangeal joint and plantar heel pain right.  States is been aching times several weeks worse at the end of the day fine in the mornings she gone back to work full duty from her left foot surgery and is having trouble working all day on that foot.  She states that the left one is fine but the right one is really is killing her.  States that the right foot has become worse and worse over the past several months.  Objective: I have reviewed her past medical history medications allergies surgery social history it is all unchanged.  Vital signs are stable she is alert and oriented x3 pulses are palpable.  Neurologic sensorium is intact.  Deep tendon reflexes are intact.  Muscle strength is normal symmetrical bilateral.  Moderate to severe bunion deformity of the left foot was resolved with surgery now she has the same on the right foot which appears to be a little worse radiographically.  She also has a hallux interphalangeal mid rotation and valgus at the level of the interphalangeal joint.  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  Plan: Consented her today for surgery to her right foot consisting of an Austin and Akin osteotomy with screws.  She understands this and is amenable to it.  I will follow-up with her in the near future for surgical intervention we did discuss the possible postop complications which may include but not limited to postop pain bleeding swelling infection recurrence need for further surgery overcorrection under correction loss of digit loss of limb loss of life.  We will also go ahead and provide for her a limited work note and I will follow-up with her at the time of surgery.

## 2020-04-17 ENCOUNTER — Other Ambulatory Visit: Payer: Self-pay

## 2020-04-17 ENCOUNTER — Emergency Department (HOSPITAL_COMMUNITY)
Admission: EM | Admit: 2020-04-17 | Discharge: 2020-04-18 | Disposition: A | Discharge status: Home / Self Care | Payer: Medicaid Other | Attending: Emergency Medicine | Admitting: Emergency Medicine

## 2020-04-17 ENCOUNTER — Encounter (HOSPITAL_COMMUNITY): Payer: Self-pay

## 2020-04-17 DIAGNOSIS — J029 Acute pharyngitis, unspecified: Secondary | ICD-10-CM | POA: Diagnosis not present

## 2020-04-17 DIAGNOSIS — Z5321 Procedure and treatment not carried out due to patient leaving prior to being seen by health care provider: Secondary | ICD-10-CM | POA: Insufficient documentation

## 2020-04-17 NOTE — ED Triage Notes (Signed)
Pt states she has a sore throat, cough, body aches and felt like she had a fever. Pt denies drooling or shortness of breath.

## 2020-04-17 NOTE — ED Provider Notes (Cosign Needed)
Emergency Medicine Provider Triage Evaluation Note   HPI Flu-like symptoms onset this afternoon with subjective fever, myalgias, fatigue. Minimal dry cough. No SOB. Does report sore throat and odynophagia. No inability to swallow, drooling. Took Motrin 4 hours ago without relief. No known sick contacts. Is vaccinated against COVID-19.  Objective Blood pressure (!) 141/104, pulse 86, temperature 98.1 F (36.7 C), temperature source Oral, resp. rate 17, SpO2 96 %.  Physical Exam Constitutional:      Appearance: She is not toxic-appearing.  HENT:     Head: Atraumatic.     Mouth/Throat:     Mouth: Mucous membranes are moist.     Pharynx: Oropharyngeal exudate (punctate) and posterior oropharyngeal erythema present. No uvula swelling.  Cardiovascular:     Rate and Rhythm: Normal rate and regular rhythm.     Pulses: Normal pulses.  Pulmonary:     Effort: Pulmonary effort is normal. No respiratory distress.     Breath sounds: No wheezing, rhonchi or rales.  Neurological:     Mental Status: She is alert.     Coordination: Coordination normal.     Medical Decision Making Medically screening exam initiated at 11:59 PM Appropriate orders placed.  Melody Grant was informed that the remainder of the evaluation will be completed by another provider, this initial triage assessment does not replace that evaluation, and the importance of remaining in the ED until their evaluation is complete.  Clinical Impression Sore throat, myalgias. Strep swab pending.    Antony Madura, PA-C 04/18/20 0001

## 2020-04-18 LAB — GROUP A STREP BY PCR: Group A Strep by PCR: NOT DETECTED

## 2020-04-18 NOTE — ED Notes (Signed)
Pt decided to leave 

## 2020-12-13 IMAGING — CR DG CHEST 2V
2 series · 2 of 2 positions shown · non-contrast
Comparison: None.

CLINICAL DATA: Right-sided chest pain for 1 week.

EXAM:
CHEST - 2 VIEW

[w chest pa]
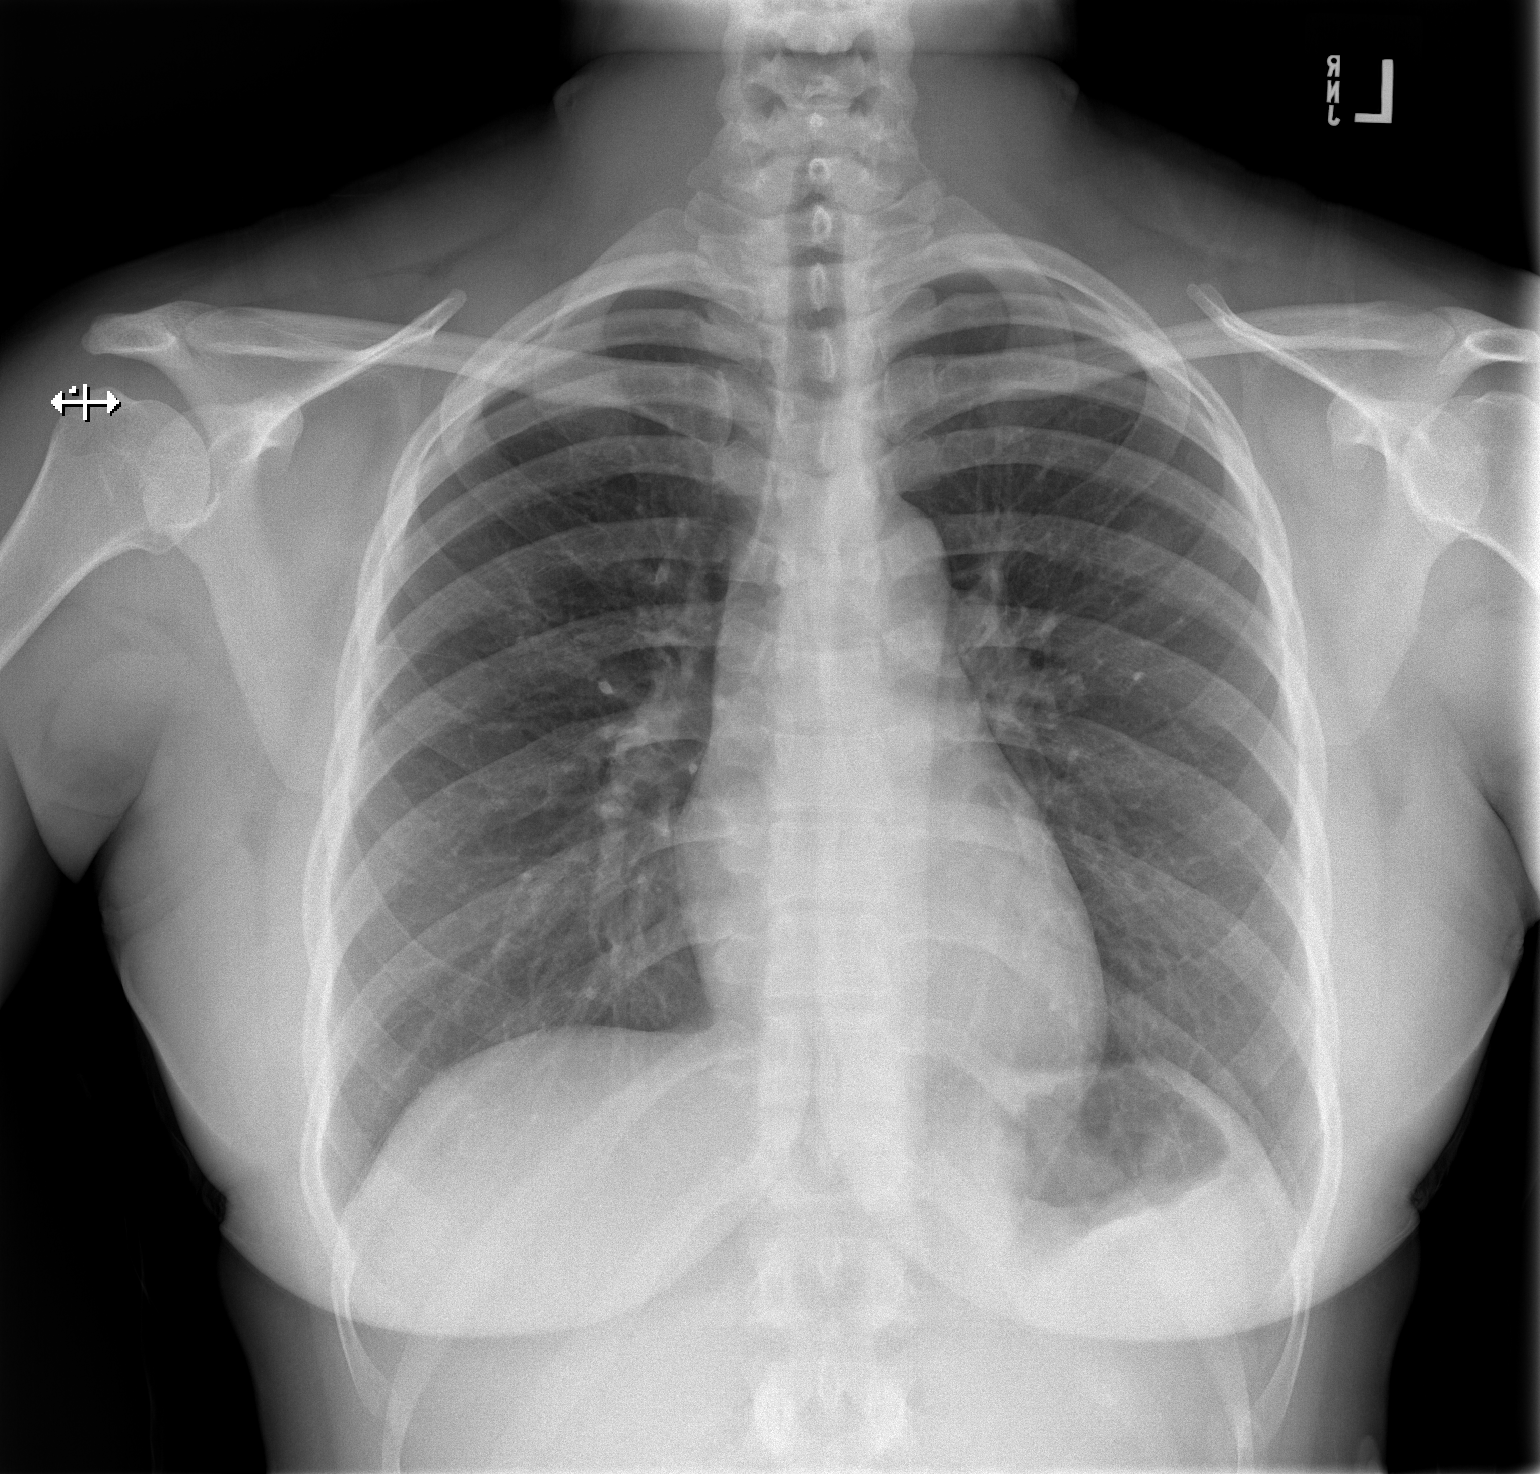

[w chest lat]
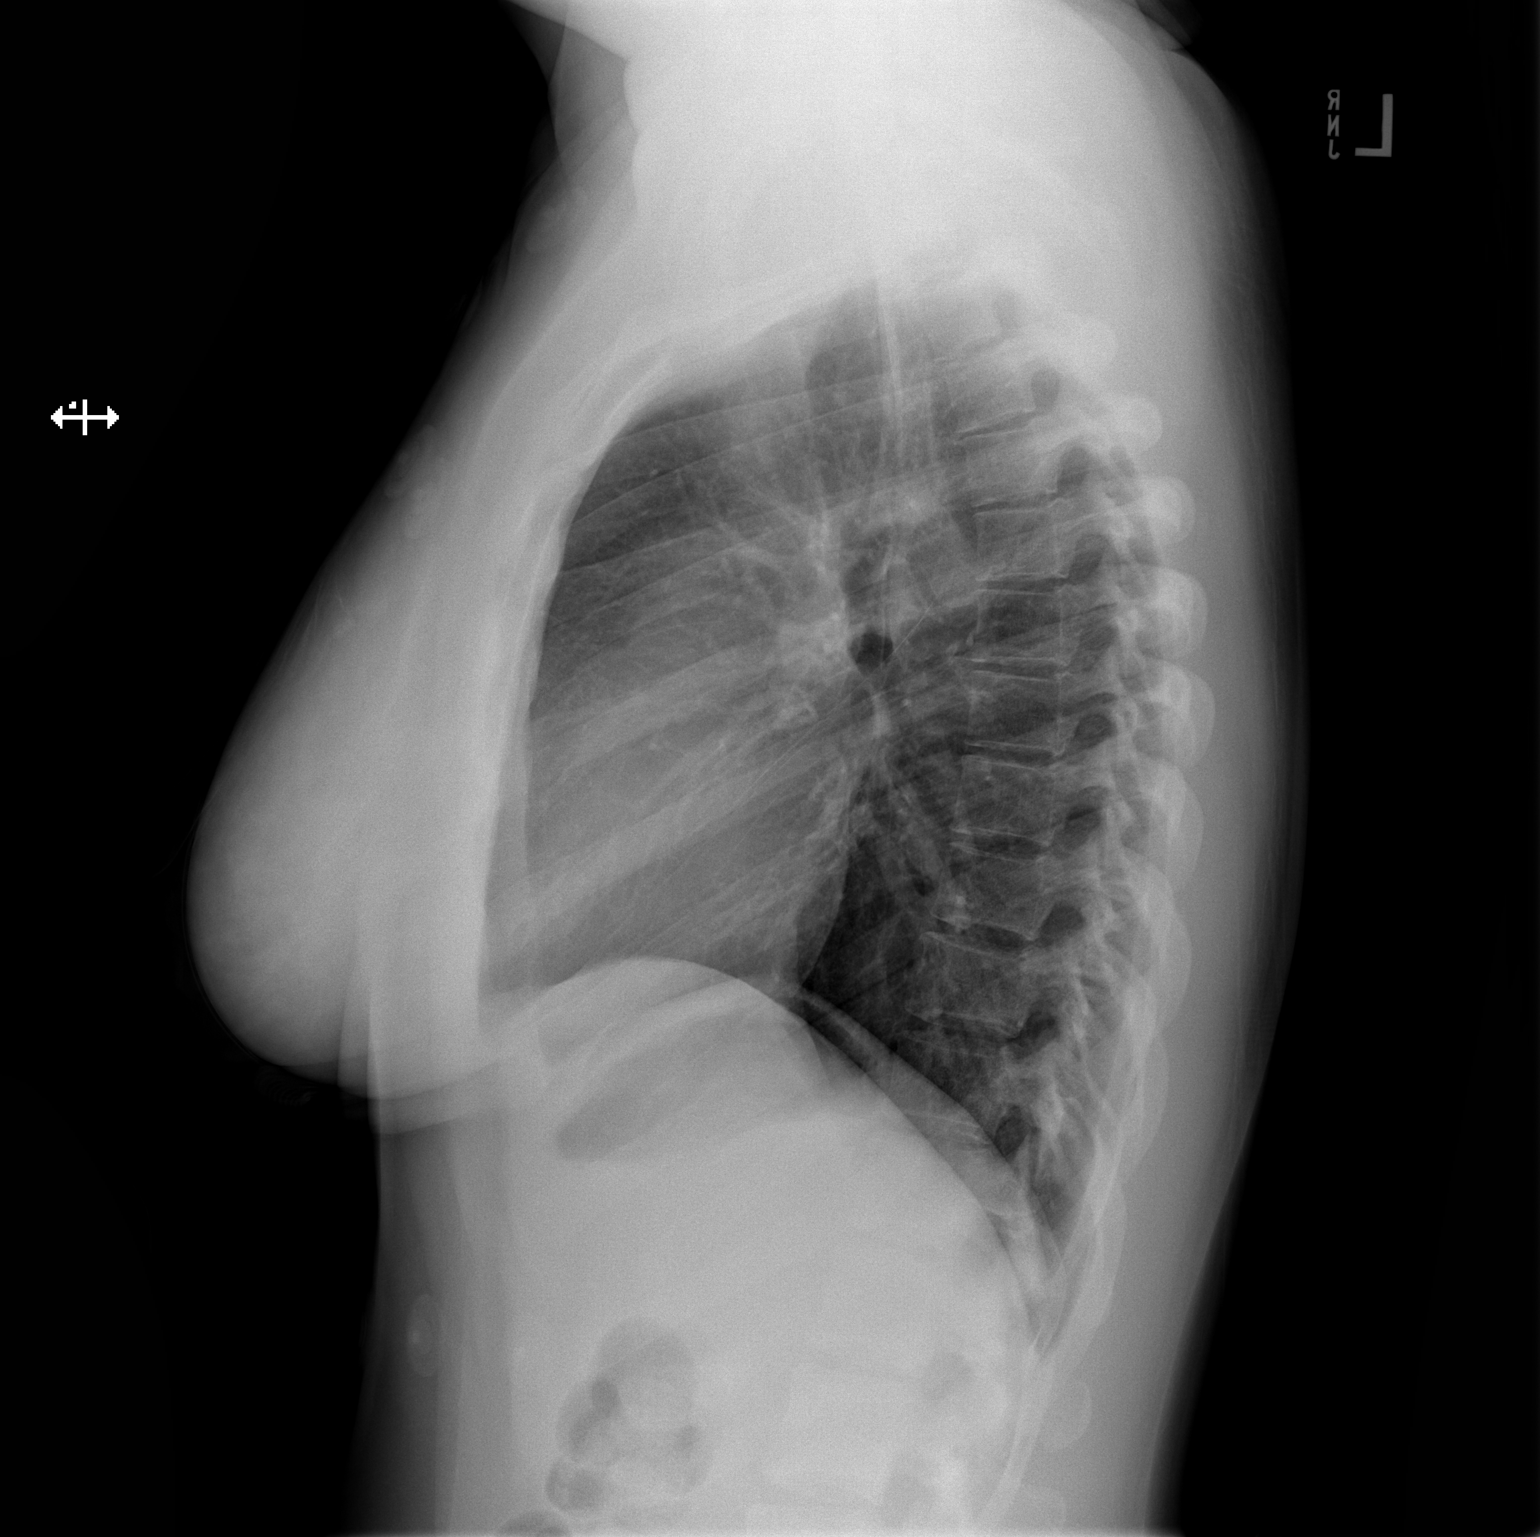

[2 of 2 positions shown; findings below may reference images not displayed]

FINDINGS: The heart size and mediastinal contours are within normal limits.
Both lungs are clear. The visualized skeletal structures are
unremarkable.
IMPRESSION: Normal study.  No active cardiopulmonary disease.

## 2021-01-10 ENCOUNTER — Emergency Department (HOSPITAL_COMMUNITY)
Admission: EM | Admit: 2021-01-10 | Discharge: 2021-01-10 | Disposition: A | Discharge status: Home / Self Care | Payer: Medicaid Other | Attending: Emergency Medicine | Admitting: Emergency Medicine

## 2021-01-10 ENCOUNTER — Encounter (HOSPITAL_COMMUNITY): Payer: Self-pay

## 2021-01-10 ENCOUNTER — Emergency Department (HOSPITAL_COMMUNITY): Payer: Medicaid Other

## 2021-01-10 DIAGNOSIS — R0602 Shortness of breath: Secondary | ICD-10-CM | POA: Diagnosis not present

## 2021-01-10 DIAGNOSIS — Z79899 Other long term (current) drug therapy: Secondary | ICD-10-CM | POA: Diagnosis not present

## 2021-01-10 DIAGNOSIS — R072 Precordial pain: Secondary | ICD-10-CM | POA: Diagnosis present

## 2021-01-10 DIAGNOSIS — I10 Essential (primary) hypertension: Secondary | ICD-10-CM | POA: Diagnosis not present

## 2021-01-10 LAB — BASIC METABOLIC PANEL
Anion gap: 6 (ref 5–15)
BUN: 14 mg/dL (ref 6–20)
CO2: 25 mmol/L (ref 22–32)
Calcium: 8.9 mg/dL (ref 8.9–10.3)
Chloride: 102 mmol/L (ref 98–111)
Creatinine, Ser: 0.69 mg/dL (ref 0.44–1.00)
GFR, Estimated: >60 mL/min (ref >60)
Glucose, Bld: 107 mg/dL — ABNORMAL HIGH (ref 70–99)
Potassium: 3.2 mmol/L — ABNORMAL LOW (ref 3.5–5.1)
Sodium: 133 mmol/L — ABNORMAL LOW (ref 135–145)

## 2021-01-10 LAB — HEPATIC FUNCTION PANEL
ALT: 16 U/L (ref 0–44)
AST: 20 U/L (ref 15–41)
Albumin: 3.8 g/dL (ref 3.5–5.0)
Alkaline Phosphatase: 39 U/L (ref 38–126)
Bilirubin, Direct: <0.1 mg/dL (ref 0.0–0.2)
Total Bilirubin: 0.5 mg/dL (ref 0.3–1.2)
Total Protein: 7.5 g/dL (ref 6.5–8.1)

## 2021-01-10 LAB — TROPONIN I (HIGH SENSITIVITY)
Troponin I (High Sensitivity): 3 ng/L (ref <18)
Troponin I (High Sensitivity): 3 ng/L (ref <18)

## 2021-01-10 LAB — I-STAT BETA HCG BLOOD, ED (MC, WL, AP ONLY): I-stat hCG, quantitative: <5 m[IU]/mL (ref <5)

## 2021-01-10 LAB — CBC
HCT: 37.4 % (ref 36.0–46.0)
Hemoglobin: 12.7 g/dL (ref 12.0–15.0)
MCH: 31.8 pg (ref 26.0–34.0)
MCHC: 34 g/dL (ref 30.0–36.0)
MCV: 93.5 fL (ref 80.0–100.0)
Platelets: 379 10*3/uL (ref 150–400)
RBC: 4 MIL/uL (ref 3.87–5.11)
RDW: 11.9 % (ref 11.5–15.5)
WBC: 4.7 10*3/uL (ref 4.0–10.5)
nRBC: 0 % (ref 0.0–0.2)

## 2021-01-10 LAB — D-DIMER, QUANTITATIVE: D-Dimer, Quant: <0.27 ug{FEU}/mL (ref 0.00–0.50)

## 2021-01-10 LAB — LIPASE, BLOOD: Lipase: 32 U/L (ref 11–51)

## 2021-01-10 NOTE — ED Provider Notes (Signed)
[facility] DEPT Provider Note   CSN: [id number] Arrival date & time: 01/10/21  0402     History Chief Complaint  Patient presents with   Chest Pain    Melody Grant is a 35 y.o. female with history of hypertension who presents for evaluation of chest pain that started yesterday.  Patient states that this feels similar to previous episodes of elevated high blood pressure although this is more severe.  Pain is substernal and feels sharp.  She is currently on amlodipine, losartan and meloxicam at home.  She states she is in close contact with her PCP he has been considering changing the dose recently.  She does have mild shortness of breath. denies fever cough, congestion, abdominal pain, N/V/D, urinary symptoms, back pain, headaches and vision changes.  She denies sick contacts, recent travel.  Although she does endorse recent cold symptoms about 5 days ago.   Chest Pain Associated symptoms: shortness of breath   Associated symptoms: no abdominal pain, no cough, no fever, no headache and no vomiting       Past Medical History:  Diagnosis Date   Hypertension     Patient Active Problem List   Diagnosis Date Noted   History of hypertension 04/26/2018   Atypical endocervical cells on cervical Papanicolaou smear 08/05/2016    History reviewed. No pertinent surgical history.   OB History   No obstetric history on file.     History reviewed. No pertinent family history.  Social History   Tobacco Use   Smoking status: Never   Smokeless tobacco: Never  Vaping Use   Vaping Use: Never used  Substance Use Topics   Alcohol use: No   Drug use: No    Home Medications Prior to Admission medications   Medication Sig Start Date End Date Taking? Authorizing Provider  amLODipine (NORVASC) 5 MG tablet Take by mouth. 08/22/18   [provider]  losartan (COZAAR) 50 MG tablet Take 1 tablet (50 mg total) by mouth daily. 10/15/17   Carlisle Cater, PA-C  meloxicam (MOBIC) 15 MG tablet Take 1 tablet (15 mg total) by mouth daily. 03/07/19   Hyatt, Max T, DPM    Allergies    Patient has no known allergies.  Review of Systems   Review of Systems  Constitutional:  Negative for fever.  HENT: Negative.    Eyes: Negative.   Respiratory:  Positive for shortness of breath. Negative for cough.   Cardiovascular:  Positive for chest pain.  Gastrointestinal:  Negative for abdominal pain and vomiting.  Endocrine: Negative.   Genitourinary: Negative.   Musculoskeletal: Negative.   Skin:  Negative for rash.  Neurological:  Negative for headaches.  All other systems reviewed and are negative.  Physical Exam Updated Vital Signs BP (!) 127/97    Pulse 84    Temp 99 F (37.2 C) (Oral)    Resp 17    SpO2 100%   Physical Exam Vitals and nursing note reviewed.  Constitutional:      General: She is not in acute distress.    Appearance: She is not ill-appearing.  HENT:     Head: Atraumatic.  Eyes:     Extraocular Movements: Extraocular movements intact.     Conjunctiva/sclera: Conjunctivae normal.  Cardiovascular:     Rate and Rhythm: Normal rate and regular rhythm.     Pulses: Normal pulses.          Radial pulses are 2+ on the right side and  2+ on the left side.       Dorsalis pedis pulses are 2+ on the right side and 2+ on the left side.     Heart sounds: No murmur heard. Pulmonary:     Effort: Pulmonary effort is normal. No respiratory distress.     Breath sounds: Normal breath sounds.     Comments: Lung clear to ausculation bilaterally. No tachypnea, no accessory muscle use, no acute distress, no increased work of breathing, no decrease in air movement  Chest:     Comments: No chest wall tenderness Abdominal:     General: Abdomen is flat. There is no distension.     Palpations: Abdomen is soft.     Tenderness: There is no abdominal tenderness.  Musculoskeletal:        General: Normal range of motion.     Cervical back:  Normal range of motion.  Skin:    General: Skin is warm and dry.     Capillary Refill: Capillary refill takes less than 2 seconds.  Neurological:     General: No focal deficit present.     Mental Status: She is alert.  Psychiatric:        Mood and Affect: Mood normal.    ED Results / Procedures / Treatments   Labs (all labs ordered are listed, but only abnormal results are displayed) Labs Reviewed  BASIC METABOLIC PANEL - Abnormal; Notable for the following components:      Result Value   Sodium 133 (*)    Potassium 3.2 (*)    Glucose, Bld 107 (*)    All other components within normal limits  CBC  HEPATIC FUNCTION PANEL  LIPASE, BLOOD  D-DIMER, QUANTITATIVE  I-STAT BETA HCG BLOOD, ED ([facility], [facility], [facility] ONLY)  TROPONIN I (HIGH SENSITIVITY)  TROPONIN I (HIGH SENSITIVITY)    EKG EKG Interpretation  Date/Time:  Friday January 10 2021 05:10:48 EST Ventricular Rate:  79 PR Interval:  188 QRS Duration: 89 QT Interval:  410 QTC Calculation: 470 R Axis:   77 Text Interpretation: Sinus rhythm Borderline T abnormalities, anterior leads Unchanged from July 2020 tracing Confirmed by Alona Bene [phone]) on 01/10/2021 5:26:46 AM  Radiology DG Chest 2 View  Result Date: 01/10/2021 CLINICAL DATA:  35 year old female with history of shortness of breath and mid chest pain for the past several hours. EXAM: CHEST - 2 VIEW COMPARISON:  Chest x-ray 07/24/2018. FINDINGS: Lung volumes are normal. No consolidative airspace disease. No pleural effusions. No pneumothorax. No pulmonary nodule or mass noted. Pulmonary vasculature and the cardiomediastinal silhouette are within normal limits. IMPRESSION: No radiographic evidence of acute cardiopulmonary disease. Electronically Signed   By: Trudie Reed M.D.   On: 01/10/2021 05:03    Procedures Procedures   Medications Ordered in ED Medications - No data to display  ED Course  I have reviewed the triage vital signs and the nursing  notes.  Pertinent labs & imaging results that were available during my care of the patient were reviewed by me and considered in my medical decision making (see chart for details).    MDM Rules/Calculators/A&P                         This is a 35 year old female presents for evaluation of chest pain.  This is a complaint that carries with it a high risk of morbidity and mortality thus increasing the complexity of the case. The emergent differential diagnosis of chest pain includes:  Acute coronary syndrome, pericarditis, aortic dissection, pulmonary embolism, tension pneumothorax, and esophageal rupture.  I do not believe the patient has an emergent cause of chest pain, other urgent/non-acute considerations include, but are not limited to: Hypertensive episode, chronic angina, aortic stenosis, cardiomyopathy, myocarditis, mitral valve prolapse, pulmonary hypertension, hypertrophic obstructive cardiomyopathy (HOCM), aortic insufficiency, right ventricular hypertrophy, pneumonia, pleuritis, bronchitis, pneumothorax, tumor, gastroesophageal reflux disease (GERD), esophageal spasm, Mallory-Weiss syndrome, peptic ulcer disease, biliary disease, pancreatitis, functional gastrointestinal pain, cervical or thoracic disk disease or arthritis, shoulder arthritis, costochondritis, subacromial bursitis, anxiety or panic attack, herpes zoster, breast disorders, chest wall tumors, thoracic outlet syndrome, mediastinitis.  Labs ordered in ED: All labs were individually reviewed and interpreted by myself.  Pertinent results include normal troponin, normal lipase, D-dimer normal, CBC unremarkable, PDMP unremarkable, hepatic function normal.  Imaging ordered in ED: Chest x-ray normal.  Imaging was individually reviewed and interpreted by myself.  I agree with the radiologist interpretation.  EKG with normal sinus rhythm, similar to previous EKGs.  Disposition: Discharge with outpatient follow-up 1.  High blood  pressure-given patient's reassuring physical exam and work-up, her pain is likely due to the increase in her diastolic pressure.  She has been having diastolic pressures at around 100.  Patient is feeling better at reevaluation.  At this point, she is safe to discharge home with outpatient follow-up.  I strongly urged her to contact her PCP to adjust her medications as needed.  All questions were asked and answered.  Patient is understanding and amenable to plan.  Discharged home in good condition. Final Clinical Impression(s) / ED Diagnoses Final diagnoses:  None    Rx / DC Orders ED Discharge Orders     None        Rodena Piety 01/10/21 1329    Truddie Hidden, MD 01/10/21 1425

## 2021-01-10 NOTE — ED Provider Notes (Signed)
Emergency Medicine Provider Triage Evaluation Note  Melody Grant , a 35 y.o. female  was evaluated in triage.  Pt complains of acute onset sharp, central chest pain with some associated SOB symptoms. No fever. Patient reports more mild pain in the past with HTN but nothing ever this severe.   Review of Systems  Positive: CP and SOB Negative: Fever, cough, abdominal/back pain   Physical Exam  BP (!) 138/103 (BP Location: Left Arm)    Pulse 77    Temp 98.1 F (36.7 C) (Oral)    Resp 20    SpO2 100%  Gen:   Awake, no distress   Resp:  Normal effort  MSK:   Moves extremities without difficulty  Other:  Awake and alert.   Medical Decision Making  Medically screening exam initiated at 4:30 AM.  Appropriate orders placed.  Melody Grant was informed that the remainder of the evaluation will be completed by another provider, this initial triage assessment does not replace that evaluation, and the importance of remaining in the ED until their evaluation is complete.  Patient describing acute onset, sharp pain with SOB. Vitals are unremarkable. Plan for labs including d-dimer given pain quality and onset.    Maia Plan, MD 01/10/21 [phone]

## 2021-01-10 NOTE — ED Triage Notes (Addendum)
Patient from home with complaint of centralized chest pain that does not radiate that began this morning. Pt endorses SOB, dizziness, and weakness. Hx of HTN, last took BP medication yesterday morning.

## 2021-01-10 NOTE — ED Notes (Signed)
Pt taken to xray 

## 2021-01-10 NOTE — Discharge Instructions (Signed)
Your work up today was all very reassuring. Your labs were normal, chest xray was normal and your EKG was unchanged since your previous one. Given your physical exam and lab results, I suspect that your chest pain is coming from your elevated blood pressure. Sometimes this can temporarily increase from having a viral infection, which sounds like you recently had. I would continue taking your blood pressure medications and reach out to you primary care doctor to see if he suggests any tweaks or changes to keep your pressures under control.  Please return if you develop eye pain, vision changes or worsening chest pain and shortness of breath.

## 2023-04-02 ENCOUNTER — Emergency Department (HOSPITAL_COMMUNITY)

## 2023-04-02 ENCOUNTER — Emergency Department (HOSPITAL_COMMUNITY)
Admission: EM | Admit: 2023-04-02 | Discharge: 2023-04-02 | Disposition: A | Discharge status: Home / Self Care | Attending: Emergency Medicine | Admitting: Emergency Medicine

## 2023-04-02 ENCOUNTER — Encounter (HOSPITAL_COMMUNITY): Payer: Self-pay

## 2023-04-02 DIAGNOSIS — Z79899 Other long term (current) drug therapy: Secondary | ICD-10-CM | POA: Insufficient documentation

## 2023-04-02 DIAGNOSIS — I10 Essential (primary) hypertension: Secondary | ICD-10-CM | POA: Diagnosis not present

## 2023-04-02 DIAGNOSIS — R079 Chest pain, unspecified: Secondary | ICD-10-CM | POA: Diagnosis present

## 2023-04-02 LAB — CBC
HCT: 36.9 % (ref 36.0–46.0)
Hemoglobin: 12.5 g/dL (ref 12.0–15.0)
MCH: 31.7 pg (ref 26.0–34.0)
MCHC: 33.9 g/dL (ref 30.0–36.0)
MCV: 93.7 fL (ref 80.0–100.0)
Platelets: 359 10*3/uL (ref 150–400)
RBC: 3.94 MIL/uL (ref 3.87–5.11)
RDW: 11.9 % (ref 11.5–15.5)
WBC: 4.6 10*3/uL (ref 4.0–10.5)
nRBC: 0 % (ref 0.0–0.2)

## 2023-04-02 LAB — BASIC METABOLIC PANEL
Anion gap: 7 (ref 5–15)
BUN: 8 mg/dL (ref 6–20)
CO2: 24 mmol/L (ref 22–32)
Calcium: 8.8 mg/dL — ABNORMAL LOW (ref 8.9–10.3)
Chloride: 105 mmol/L (ref 98–111)
Creatinine, Ser: 0.79 mg/dL (ref 0.44–1.00)
GFR, Estimated: >60 mL/min (ref >60)
Glucose, Bld: 108 mg/dL — ABNORMAL HIGH (ref 70–99)
Potassium: 3.5 mmol/L (ref 3.5–5.1)
Sodium: 136 mmol/L (ref 135–145)

## 2023-04-02 LAB — TROPONIN I (HIGH SENSITIVITY): Troponin I (High Sensitivity): 2 ng/L (ref <18)

## 2023-04-02 LAB — HCG, SERUM, QUALITATIVE: Preg, Serum: NEGATIVE

## 2023-04-02 NOTE — Discharge Instructions (Addendum)
 The cause of your chest pain is unclear at this time.  Your blood pressure is little bit elevated today, so you should follow-up with your cardiologist, for blood pressure management.  Your heart enzymes, today are within normal limits.  Please follow-up with your PCP, for further evaluation.  Return to the ER if you have severe chest pain, shortness of breath, or dizziness

## 2023-04-02 NOTE — ED Provider Notes (Signed)
 [facility] EMERGENCY DEPARTMENT AT [facility] Provider Note   CSN: [id number] Arrival date & time: 04/02/23  0602     History  Chief Complaint  Patient presents with   Chest Pain    Melody Grant is a 38 y.o. female, history of hypertension, who presents to the ED secondary to chest pain, has been going on for the last couple days.  She states that she just got over the flu, and the last few days, she has had some chest pressure.  She has no associated shortness of breath, or radiation of the pain.  She just describes it as pressurized, and worse with movements.  She notes that itches feels very tight.  Has had this happen before, with elevated blood pressure, but states this time it feels a little bit different.  No nausea, vomiting.  Pain is not worse when leaning forward, or laying back.    Home Medications Prior to Admission medications   Medication Sig Start Date End Date Taking? Authorizing Provider  amLODipine (NORVASC) 5 MG tablet Take by mouth. 08/22/18   [provider]  losartan (COZAAR) 50 MG tablet Take 1 tablet (50 mg total) by mouth daily. 10/15/17   Renne Crigler, PA-C  meloxicam (MOBIC) 15 MG tablet Take 1 tablet (15 mg total) by mouth daily. 03/07/19   Hyatt, Max T, DPM      Allergies    Patient has no known allergies.    Review of Systems   Review of Systems  Respiratory:  Negative for shortness of breath.   Cardiovascular:  Positive for chest pain.    Physical Exam Updated Vital Signs BP (!) 132/95   Pulse 70   Temp 98 F (36.7 C) (Oral)   Resp 16   SpO2 100%  Physical Exam Vitals and nursing note reviewed.  Constitutional:      General: She is not in acute distress.    Appearance: She is well-developed.  HENT:     Head: Normocephalic and atraumatic.  Eyes:     Conjunctiva/sclera: Conjunctivae normal.  Cardiovascular:     Rate and Rhythm: Normal rate and regular rhythm.     Heart sounds: No murmur heard. Pulmonary:      Effort: Pulmonary effort is normal. No respiratory distress.     Breath sounds: Normal breath sounds.  Chest:     Comments: Chest wall tenderness to palpation Abdominal:     Palpations: Abdomen is soft.     Tenderness: There is no abdominal tenderness.  Musculoskeletal:        General: No swelling.     Cervical back: Neck supple.  Skin:    General: Skin is warm and dry.     Capillary Refill: Capillary refill takes less than 2 seconds.  Neurological:     Mental Status: She is alert.  Psychiatric:        Mood and Affect: Mood normal.     ED Results / Procedures / Treatments   Labs (all labs ordered are listed, but only abnormal results are displayed) Labs Reviewed  BASIC METABOLIC PANEL - Abnormal; Notable for the following components:      Result Value   Glucose, Bld 108 (*)    Calcium 8.8 (*)    All other components within normal limits  CBC  HCG, SERUM, QUALITATIVE  TROPONIN I (HIGH SENSITIVITY)  TROPONIN I (HIGH SENSITIVITY)    EKG EKG Interpretation Date/Time:  Friday April 02 2023 06:14:06 EDT Ventricular Rate:  69  PR Interval:  189 QRS Duration:  90 QT Interval:  413 QTC Calculation: 443 R Axis:   66  Text Interpretation: Sinus rhythm Borderline T abnormalities, anterior leads When compared with ECG of 01/10/2021, No significant change was found Confirmed by Dione Booze ([id number]) on 04/02/2023 6:19:14 AM  Radiology DG Chest 2 View Result Date: 04/02/2023 CLINICAL DATA:  38 year old female with mid chest pain onset last night. EXAM: CHEST - 2 VIEW COMPARISON:  Chest radiographs 01/10/2021 and earlier. FINDINGS: PA and lateral views are 0647 hours. Lung volumes and mediastinal contours remain normal. Visualized tracheal air column is within normal limits. Both lungs appear clear. No pneumothorax or pleural effusion. No osseous abnormality identified. Negative visible bowel gas. IMPRESSION: Negative.  No cardiopulmonary abnormality. Electronically Signed   By: Odessa Fleming  M.D.   On: 04/02/2023 06:55    Procedures Procedures    Medications Ordered in ED Medications - No data to display  ED Course/ Medical Decision Making/ A&P                                 Medical Decision Making Patient is a 38 year old female, here for chest pain, has been going on for about a day.  She states that it hurts more with movement, and she did have the flu last week.  She has chest wall tenderness on my exam, also states that her chest pain, sometimes occurs, when her blood pressure is elevated.  She is mildly hypertensive here, has been taking her meds.  We will obtain a chest x-ray, troponin, and blood work for further evaluation.  Amount and/or Complexity of Data Reviewed Labs: ordered.    Details: Troponin within normal limits, no electrolyte abnormalities Radiology: ordered.    Details: Chest x-ray clear ECG/medicine tests:  Decision-making details documented in ED Course. Discussion of management or test interpretation with external provider(s): Patient is a 38 year old female, here for chest pain for the last day.  Worse with movement, she has chest wall tenderness on exam, I am suspicious for possible costochondritis, after having a respiratory illness.  Her pain is not pleuritic, which is overall reassuring, and she is well-appearing.  She has no murmurs or rubs on exam.  Troponin is normal.  Discussed possible muscle strain, versus costochondritis, she voiced understanding.  Will take Tylenol for pain control, as well as use heat to help with pain.  We discussed return precautions and she voiced understanding.  Informed to follow-up with PCP, or cardiologist, for hypertensive control, as she is already established.    Final Clinical Impression(s) / ED Diagnoses Final diagnoses:  Chest pain, unspecified type    Rx / DC Orders ED Discharge Orders     None         Booker Bhatnagar, Harley [place], PA 04/02/23 [id number]    Gwyneth Sprout, MD 04/02/23 [phone]

## 2023-04-02 NOTE — ED Triage Notes (Signed)
 Pt states that she has been having CP for that past week ever since she was sick with the flu last week.

## 2023-10-15 ENCOUNTER — Other Ambulatory Visit: Payer: Self-pay

## 2023-10-15 ENCOUNTER — Ambulatory Visit
Admission: EM | Admit: 2023-10-15 | Discharge: 2023-10-15 | Disposition: A | Discharge status: Home / Self Care | Attending: Family Medicine | Admitting: Family Medicine

## 2023-10-15 DIAGNOSIS — Z23 Encounter for immunization: Secondary | ICD-10-CM | POA: Diagnosis not present

## 2023-10-15 DIAGNOSIS — S61412A Laceration without foreign body of left hand, initial encounter: Secondary | ICD-10-CM | POA: Diagnosis not present

## 2023-10-15 MED ORDER — TETANUS-DIPHTH-ACELL PERTUSSIS 5-2-15.5 LF-MCG/0.5 IM SUSP
0.5000 mL | Freq: Once | INTRAMUSCULAR | Status: AC
  Administered 2023-10-15: 0.5 mL via INTRAMUSCULAR

## 2023-10-15 NOTE — Discharge Instructions (Addendum)
 The glue used on your wound will dissolve on its own over 5 days.  Do not get the wound wet and keep it covered for 24 hours.  After that you may gently cleanse but do not soak, scrub or saturate the area.  Monitor for any signs of infection such as redness, swelling, drainage and seek reevaluation if these occur.  Your tetanus was updated at the clinic today and is good for 10 years.  Follow-up with your PCP as you need.  Please go to the ER if you have any worsening symptoms.  Hope you feel better soon!

## 2023-10-15 NOTE — ED Provider Notes (Addendum)
 [facility]    CSN: [id number] Arrival date & time: 10/15/23  1851      History   Chief Complaint Chief Complaint  Patient presents with   Extremity Laceration    HPI Melody Grant is a 38 y.o. female presents for a laceration.  Patient states around 1130 this morning she was at work using a box cutter and excellently cut her left palm.  States she poured peroxide on it and placed a dressing on it and went about her day.  When she got home today started bleeding prompting her to come in for evaluation.  She is not on blood thinning medications.  She is not up-to-date on her tetanus.  No other concerns at this time.  HPI  Past Medical History:  Diagnosis Date   Hypertension     Patient Active Problem List   Diagnosis Date Noted   History of hypertension 04/26/2018   Atypical endocervical cells on cervical Papanicolaou smear 08/05/2016    History reviewed. No pertinent surgical history.  OB History   No obstetric history on file.      Home Medications    Prior to Admission medications   Medication Sig Start Date End Date Taking? Authorizing Provider  amLODipine (NORVASC) 5 MG tablet Take by mouth. 08/22/18   [provider]  losartan  (COZAAR ) 50 MG tablet Take 1 tablet (50 mg total) by mouth daily. 10/15/17   Geiple, Joshua, PA-C  meloxicam  (MOBIC ) 15 MG tablet Take 1 tablet (15 mg total) by mouth daily. 03/07/19   Hyatt, Royden DASEN, DPM    Family History History reviewed. No pertinent family history.  Social History Social History   Tobacco Use   Smoking status: Never   Smokeless tobacco: Never  Vaping Use   Vaping status: Never Used  Substance Use Topics   Alcohol use: No   Drug use: No     Allergies   Patient has no known allergies.   Review of Systems Review of Systems  Skin:        Left hand laceration     Physical Exam Triage Vital Signs ED Triage Vitals  Encounter Vitals Group     BP 10/15/23 1901 (!) 145/98     Girls  Systolic BP Percentile --      Girls Diastolic BP Percentile --      Boys Systolic BP Percentile --      Boys Diastolic BP Percentile --      Pulse Rate 10/15/23 1901 76     Resp 10/15/23 1901 16     Temp 10/15/23 1901 98.4 F (36.9 C)     Temp Source 10/15/23 1901 Oral     SpO2 10/15/23 1901 97 %     Weight --      Height --      Head Circumference --      Peak Flow --      Pain Score 10/15/23 1900 7     Pain Loc --      Pain Education --      Exclude from Growth Chart --    No data found.  Updated Vital Signs BP (!) 145/98   Pulse 76   Temp 98.4 F (36.9 C) (Oral)   Resp 16   LMP 10/13/2023   SpO2 97%   Visual Acuity Right Eye Distance:   Left Eye Distance:   Bilateral Distance:    Right Eye Near:   Left Eye Near:    Bilateral  Near:     Physical Exam Vitals and nursing note reviewed.  Constitutional:      General: She is not in acute distress.    Appearance: Normal appearance. She is not ill-appearing.  HENT:     Head: Normocephalic and atraumatic.  Eyes:     Pupils: Pupils are equal, round, and reactive to light.  Cardiovascular:     Rate and Rhythm: Normal rate.  Pulmonary:     Effort: Pulmonary effort is normal.  Musculoskeletal:       Hands:     Comments: There is a linear very superficial approximated laceration to the palmar aspect of the left hand.  There is no bleeding or swelling.  Skin:    General: Skin is warm and dry.  Neurological:     General: No focal deficit present.     Mental Status: She is alert and oriented to person, place, and time.  Psychiatric:        Mood and Affect: Mood normal.        Behavior: Behavior normal.      UC Treatments / Results  Labs (all labs ordered are listed, but only abnormal results are displayed) Labs Reviewed - No data to display  EKG   Radiology No results found.  Procedures Procedures (including critical care time)  Medications Ordered in UC Medications  Tdap (ADACEL) injection 0.5  mL (has no administration in time range)    Initial Impression / Assessment and Plan / UC Course  I have reviewed the triage vital signs and the nursing notes.  Pertinent labs & imaging results that were available during my care of the patient were reviewed by me and considered in my medical decision making (see chart for details).     Superficial laceration to palm.  Does not require suturing but did apply Dermabond and dressing after was cleaned by nursing staff.  Patient tetanus was updated in clinic.  Discussed wound care and signs and symptoms of infection.  Patient does not plan to file a claim.  Follow-up with PCP as needed.  ER precautions reviewed. Final Clinical Impressions(s) / UC Diagnoses   Final diagnoses:  Laceration of left hand without foreign body, initial encounter     Discharge Instructions      The glue used on your wound will dissolve on its own over 5 days.  Do not get the wound wet and keep it covered for 24 hours.  After that you may gently cleanse but do not soak, scrub or saturate the area.  Monitor for any signs of infection such as redness, swelling, drainage and seek reevaluation if these occur.  Your tetanus was updated at the clinic today and is good for 10 years.  Follow-up with your PCP as you need.  Please go to the ER if you have any worsening symptoms.  Hope you feel better soon!     ED Prescriptions   None    PDMP not reviewed this encounter.   Loreda Myla SAUNDERS, NP 10/15/23 1911    Loreda Myla SAUNDERS, NP 10/15/23 1921

## 2023-10-15 NOTE — ED Triage Notes (Signed)
 Pt states she was at work and using a box cutter to open a box and accidentally cut left hand at 1150 today. The laceration is 3.3cm on anterior of left hand. Bleeding is controlled.

## 2023-10-31 IMAGING — CR DG CHEST 2V
2 series · 2 of 2 positions shown · non-contrast
Comparison: Chest x-ray 07/24/2018.

CLINICAL DATA: 35-year-old female with history of shortness of
breath and mid chest pain for the past several hours.

EXAM:
CHEST - 2 VIEW

[w chest pa]
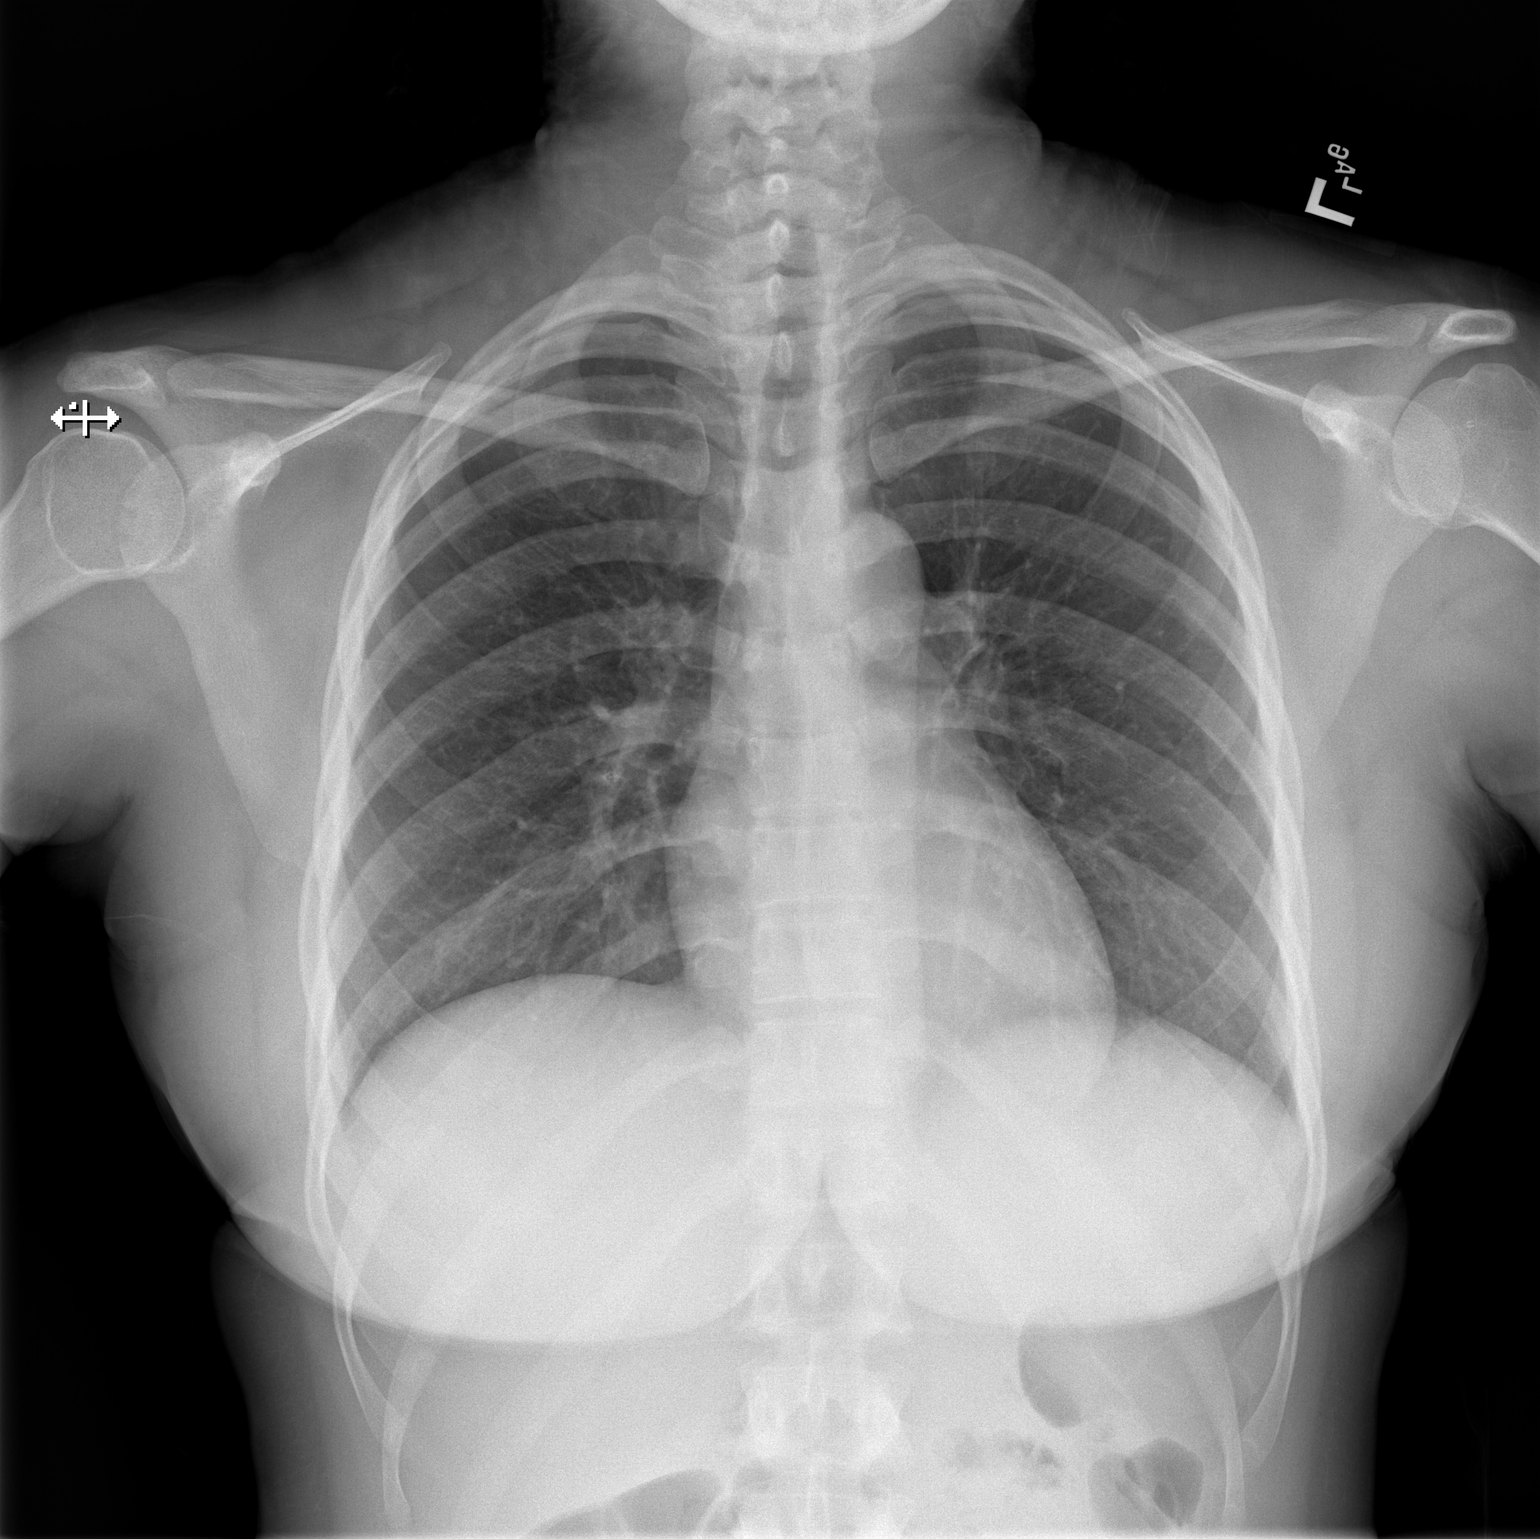

[w chest lat]
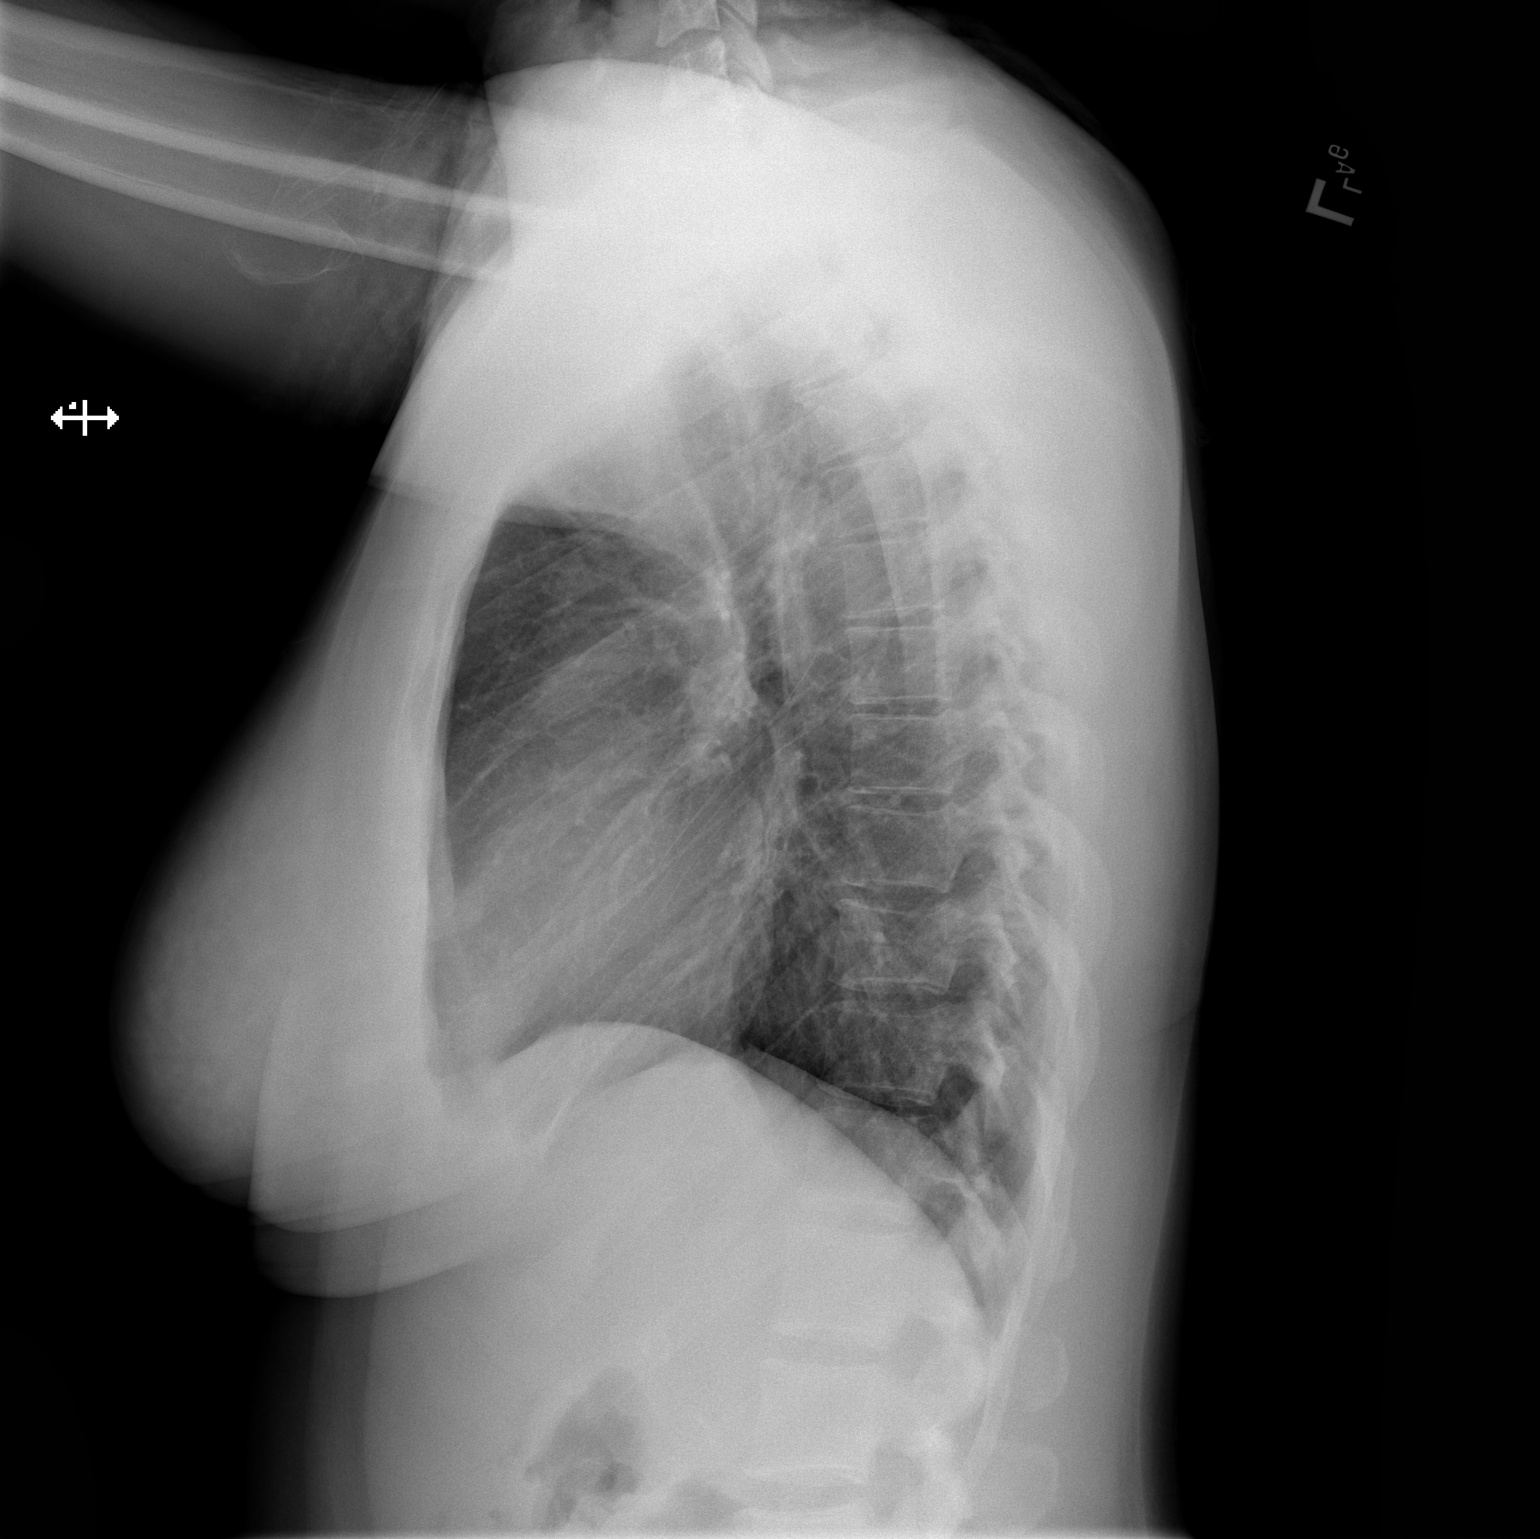

[2 of 2 positions shown; findings below may reference images not displayed]

FINDINGS: Lung volumes are normal. No consolidative airspace disease. No
pleural effusions. No pneumothorax. No pulmonary nodule or mass
noted. Pulmonary vasculature and the cardiomediastinal silhouette
are within normal limits.
IMPRESSION: No radiographic evidence of acute cardiopulmonary disease.
